# Patient Record
Sex: Male | Born: 1975 | Race: Black or African American | Hispanic: No | Marital: Married | Smoking: Never smoker
Health system: Southern US, Community
[De-identification: ages and names within clinical notes are randomized; demographics above are authoritative.]

## PROBLEM LIST (undated history)

## (undated) DIAGNOSIS — R51 Headache: Secondary | ICD-10-CM

## (undated) DIAGNOSIS — K5190 Ulcerative colitis, unspecified, without complications: Secondary | ICD-10-CM

## (undated) DIAGNOSIS — R519 Headache, unspecified: Secondary | ICD-10-CM

## (undated) DIAGNOSIS — S6990XA Unspecified injury of unspecified wrist, hand and finger(s), initial encounter: Secondary | ICD-10-CM

## (undated) DIAGNOSIS — I10 Essential (primary) hypertension: Secondary | ICD-10-CM

## (undated) HISTORY — DX: Headache, unspecified: R51.9

## (undated) HISTORY — DX: Headache: R51

## (undated) HISTORY — DX: Essential (primary) hypertension: I10

## (undated) HISTORY — PX: OTHER SURGICAL HISTORY: SHX169

## (undated) HISTORY — DX: Unspecified injury of unspecified wrist, hand and finger(s), initial encounter: S69.90XA

## (undated) HISTORY — PX: ROTATOR CUFF REPAIR: SHX139

---

## 2009-04-07 ENCOUNTER — Emergency Department (HOSPITAL_COMMUNITY): Admission: EM | Admit: 2009-04-07 | Discharge: 2009-04-08 | Payer: Self-pay | Admitting: Emergency Medicine

## 2009-04-07 ENCOUNTER — Emergency Department (HOSPITAL_COMMUNITY): Admission: EM | Admit: 2009-04-07 | Discharge: 2009-04-07 | Payer: Self-pay | Admitting: Family Medicine

## 2009-11-11 ENCOUNTER — Ambulatory Visit: Payer: Self-pay | Admitting: Internal Medicine

## 2009-11-11 DIAGNOSIS — J45909 Unspecified asthma, uncomplicated: Secondary | ICD-10-CM | POA: Insufficient documentation

## 2010-03-21 DIAGNOSIS — I10 Essential (primary) hypertension: Secondary | ICD-10-CM

## 2010-03-21 HISTORY — DX: Essential (primary) hypertension: I10
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
## 2010-05-07 ENCOUNTER — Ambulatory Visit (INDEPENDENT_AMBULATORY_CARE_PROVIDER_SITE_OTHER): Payer: BC Managed Care – PPO | Admitting: Internal Medicine

## 2010-05-07 ENCOUNTER — Encounter: Payer: Self-pay | Admitting: Internal Medicine

## 2010-05-07 ENCOUNTER — Other Ambulatory Visit: Payer: Self-pay | Admitting: Internal Medicine

## 2010-05-07 ENCOUNTER — Ambulatory Visit (HOSPITAL_BASED_OUTPATIENT_CLINIC_OR_DEPARTMENT_OTHER)
Admission: RE | Admit: 2010-05-07 | Discharge: 2010-05-07 | Disposition: A | Discharge status: Home / Self Care | Payer: BC Managed Care – PPO | Source: Ambulatory Visit | Attending: Internal Medicine | Admitting: Internal Medicine

## 2010-05-07 DIAGNOSIS — R51 Headache: Secondary | ICD-10-CM | POA: Insufficient documentation

## 2010-05-07 DIAGNOSIS — G43909 Migraine, unspecified, not intractable, without status migrainosus: Secondary | ICD-10-CM | POA: Insufficient documentation
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
## 2010-06-07 LAB — COMPREHENSIVE METABOLIC PANEL
ALT: 23 U/L (ref 0–53)
AST: 19 U/L (ref 0–37)
Albumin: 4 g/dL (ref 3.5–5.2)
Alkaline Phosphatase: 72 U/L (ref 39–117)
BUN: 8 mg/dL (ref 6–23)
CO2: 29 mEq/L (ref 19–32)
Calcium: 8.7 mg/dL (ref 8.4–10.5)
Chloride: 101 mEq/L (ref 96–112)
Creatinine, Ser: 1.08 mg/dL (ref 0.4–1.5)
GFR calc Af Amer: >60 mL/min (ref >60)
GFR calc non Af Amer: >60 mL/min (ref >60)
Glucose, Bld: 95 mg/dL (ref 70–99)
Potassium: 3.6 mEq/L (ref 3.5–5.1)
Sodium: 137 mEq/L (ref 135–145)
Total Bilirubin: 1.1 mg/dL (ref 0.3–1.2)
Total Protein: 7.6 g/dL (ref 6.0–8.3)

## 2010-06-07 LAB — BASIC METABOLIC PANEL
BUN: 9 mg/dL (ref 6–23)
CO2: 27 mEq/L (ref 19–32)
Calcium: 8.8 mg/dL (ref 8.4–10.5)
Chloride: 102 mEq/L (ref 96–112)
Creatinine, Ser: 1.07 mg/dL (ref 0.4–1.5)
GFR calc Af Amer: >60 mL/min (ref >60)
GFR calc non Af Amer: >60 mL/min (ref >60)
Glucose, Bld: 97 mg/dL (ref 70–99)
Potassium: 3.3 mEq/L — ABNORMAL LOW (ref 3.5–5.1)
Sodium: 140 mEq/L (ref 135–145)

## 2010-06-07 LAB — CBC
HCT: 44.5 % (ref 39.0–52.0)
HCT: 44.6 % (ref 39.0–52.0)
Hemoglobin: 14.9 g/dL (ref 13.0–17.0)
Hemoglobin: 14.9 g/dL (ref 13.0–17.0)
MCHC: 33.3 g/dL (ref 30.0–36.0)
MCHC: 33.5 g/dL (ref 30.0–36.0)
MCV: 95.2 fL (ref 78.0–100.0)
MCV: 96.5 fL (ref 78.0–100.0)
Platelets: 284 10*3/uL (ref 150–400)
Platelets: 293 10*3/uL (ref 150–400)
RBC: 4.62 MIL/uL (ref 4.22–5.81)
RBC: 4.67 MIL/uL (ref 4.22–5.81)
RDW: 13.8 % (ref 11.5–15.5)
RDW: 14 % (ref 11.5–15.5)
WBC: 13.9 10*3/uL — ABNORMAL HIGH (ref 4.0–10.5)
WBC: 14.4 10*3/uL — ABNORMAL HIGH (ref 4.0–10.5)

## 2010-06-07 LAB — HEMOCCULT GUIAC POC 1CARD (OFFICE): Fecal Occult Bld: NEGATIVE

## 2010-06-07 LAB — POCT URINALYSIS DIP (DEVICE)
Glucose, UA: NEGATIVE mg/dL
Ketones, ur: NEGATIVE mg/dL
Nitrite: NEGATIVE
Protein, ur: NEGATIVE mg/dL
Specific Gravity, Urine: 1.025 (ref 1.005–1.030)
Urobilinogen, UA: 1 mg/dL (ref 0.0–1.0)
pH: 5.5 (ref 5.0–8.0)

## 2010-06-07 LAB — DIFFERENTIAL
Basophils Absolute: 0.1 10*3/uL (ref 0.0–0.1)
Basophils Absolute: 0.1 10*3/uL (ref 0.0–0.1)
Basophils Relative: 0 % (ref 0–1)
Basophils Relative: 1 % (ref 0–1)
Eosinophils Absolute: 0.1 10*3/uL (ref 0.0–0.7)
Eosinophils Absolute: 0.1 10*3/uL (ref 0.0–0.7)
Eosinophils Relative: 1 % (ref 0–5)
Eosinophils Relative: 1 % (ref 0–5)
Lymphocytes Relative: 17 % (ref 12–46)
Lymphocytes Relative: 19 % (ref 12–46)
Lymphs Abs: 2.4 10*3/uL (ref 0.7–4.0)
Lymphs Abs: 2.7 10*3/uL (ref 0.7–4.0)
Monocytes Absolute: 1.1 10*3/uL — ABNORMAL HIGH (ref 0.1–1.0)
Monocytes Absolute: 1.5 10*3/uL — ABNORMAL HIGH (ref 0.1–1.0)
Monocytes Relative: 10 % (ref 3–12)
Monocytes Relative: 8 % (ref 3–12)
Neutro Abs: 10.1 10*3/uL — ABNORMAL HIGH (ref 1.7–7.7)
Neutro Abs: 10.2 10*3/uL — ABNORMAL HIGH (ref 1.7–7.7)
Neutrophils Relative %: 70 % (ref 43–77)
Neutrophils Relative %: 74 % (ref 43–77)

## 2010-06-07 LAB — LIPASE, BLOOD: Lipase: 18 U/L (ref 11–59)

## 2010-06-08 ENCOUNTER — Telehealth: Payer: Self-pay | Admitting: Family Medicine

## 2010-06-08 NOTE — Telephone Encounter (Signed)
Error

## 2010-06-10 ENCOUNTER — Encounter: Payer: Self-pay | Admitting: Internal Medicine

## 2010-06-11 ENCOUNTER — Ambulatory Visit (INDEPENDENT_AMBULATORY_CARE_PROVIDER_SITE_OTHER): Payer: BC Managed Care – PPO | Admitting: Internal Medicine

## 2010-06-11 VITALS — BP 144/92 | HR 82 | Wt 217.8 lb

## 2010-06-11 DIAGNOSIS — R51 Headache: Secondary | ICD-10-CM

## 2010-06-11 MED ORDER — HYDROCODONE-ACETAMINOPHEN 5-500 MG PO TABS: 1.0000 | ORAL_TABLET | ORAL | Status: DC | PRN

## 2010-06-11 MED ORDER — IBUPROFEN 600 MG PO TABS: 600.0000 mg | ORAL_TABLET | Freq: Three times a day (TID) | ORAL | Status: DC | PRN

## 2010-06-11 NOTE — Assessment & Plan Note (Addendum)
Several months  history of headache described as pain that originates from the right side of the neck and then goes to the  right side of the head. Good response to Motrin.  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 He continue with the headaches , they could be cervicogenic or related to the Chiari malformation. Plan:  discontinue Flexeril, continue with Motrin, add hydrocodone Refer to neuro

## 2010-06-11 NOTE — Patient Instructions (Signed)
We are referring you to a neurologist, if you don't hear from [place] in 1 week, please call the office  Came back at your convenience for a physical exam

## 2010-06-11 NOTE — Progress Notes (Signed)
  Subjective:    Patient ID: Corey Costa, male    DOB: 07/13/1975, 35 y.o.   MRN: [id number]  HPI  patient was seen several months ago with headaches, these prompted CT of the head that showed a Chiari malformation.  the patient was prescribed Flexeril and ibuprofen and asked to return to the office if the symptoms continue. He continue experiencing "headaches";  the pain starts in the right side of the neck  and then goes to the right side of the head. At some point he had some frontal sinus pain but that has  resolved. He states that ibuprofen helps a great deal, he self discontinued Flexeril because it didn't help.   Review of Systems  denies any upper extremity tingling.  Denies any GI side effects from Motrin.    Objective:   Physical Exam  Constitutional: He is oriented to person, place, and time. He appears well-developed and well-nourished.  Cardiovascular: Normal rate, regular rhythm and normal heart sounds.   Pulmonary/Chest: Effort normal and breath sounds normal.  Neurological: He is alert and oriented to person, place, and time. He has normal strength and normal reflexes. No cranial nerve deficit. Gait normal.  Psychiatric: He has a normal mood and affect. His behavior is normal. Thought content normal.          Assessment & Plan:

## 2010-06-21 ENCOUNTER — Telehealth: Payer: Self-pay | Admitting: Internal Medicine

## 2010-06-21 NOTE — Telephone Encounter (Signed)
Patient called to report that he was given an appointment to see a neurologist on 08/02/2010----he says that he cant wait that long----is there another neurologist that can take him sooner??

## 2010-06-22 NOTE — Telephone Encounter (Signed)
Left msg for return call

## 2010-06-23 MED ORDER — TRAMADOL HCL 50 MG PO TABS: 50.0000 mg | ORAL_TABLET | Freq: Four times a day (QID) | ORAL | Status: AC | PRN

## 2010-06-23 NOTE — Telephone Encounter (Signed)
Will switch to Ultram, let me know if that doesn't work [employer]

## 2010-06-23 NOTE — Telephone Encounter (Signed)
Spoke w/ pt informed rx sent to pharmacy.

## 2010-06-23 NOTE — Telephone Encounter (Signed)
Spoke w/ pt informed that appt for neuro is pretty good since it usually takes longer to get in.  Pt says that hydrocodone makes him sick to his stomach and would like to have something else to help w/ headaches.

## 2010-10-09 ENCOUNTER — Ambulatory Visit (INDEPENDENT_AMBULATORY_CARE_PROVIDER_SITE_OTHER): Payer: BC Managed Care – PPO | Admitting: Family Medicine

## 2010-10-09 ENCOUNTER — Encounter: Payer: Self-pay | Admitting: Family Medicine

## 2010-10-09 VITALS — BP 142/98 | Temp 98.5°F | Wt 217.0 lb

## 2010-10-09 DIAGNOSIS — R51 Headache: Secondary | ICD-10-CM

## 2010-10-09 DIAGNOSIS — IMO0001 Reserved for inherently not codable concepts without codable children: Secondary | ICD-10-CM

## 2010-10-09 DIAGNOSIS — R030 Elevated blood-pressure reading, without diagnosis of hypertension: Secondary | ICD-10-CM

## 2010-10-09 MED ORDER — TRAMADOL HCL 50 MG PO TABS: 50.0000 mg | ORAL_TABLET | Freq: Four times a day (QID) | ORAL | Status: AC | PRN

## 2010-10-09 NOTE — Patient Instructions (Signed)
Follow up with Dr Larose Kells regarding your chronic headaches and schedule follow up with him within 1 month to have blood pressure reassessed. May take up to 2 tramadol every 6 hours as needed for headache/neck pain.

## 2010-10-10 NOTE — Progress Notes (Signed)
  Subjective:    Patient ID: Corey Costa, male    DOB: 1976-02-14, 35 y.o.   MRN: [id number]  HPI Patient seen in [facility] with persistent headache. Going on for several months. Previously seen and had CT of head which showed question of Chiari malformation. He's tried ibuprofen and Flexeril without much relief. Saw neurologist and placed on Topamax and Imitrex without much. Has also tried hydrocodone but did not like the way he felt.  Reportedly had MRI scan performed and question of Chiari malformation not felt to be candidate for surgical intervention. Headache is sharp. No nausea or vomiting. No consistent photophobia. Occasionally worse with activity. No focal weakness or any cognitive changes. No right upper extremity pain.  Elevated blood pressure today but no confirmed history of hypertension. Still taking ibuprofen fairly regular. No history of neck injury   Review of Systems  Constitutional: Negative for fever, activity change, appetite change and unexpected weight change.  HENT: Negative for hearing loss and neck stiffness.   Eyes: Negative for photophobia and visual disturbance.  Respiratory: Negative for shortness of breath.   Cardiovascular: Negative for chest pain.  Neurological: Positive for headaches. Negative for dizziness, tremors, seizures, syncope and weakness.  Psychiatric/Behavioral: Negative for confusion.       Objective:   Physical Exam  Constitutional: He is oriented to person, place, and time. He appears well-developed and well-nourished. No distress.  HENT:  Head: Normocephalic and atraumatic.  Mouth/Throat: Oropharynx is clear and moist.  Eyes: Pupils are equal, round, and reactive to light.  Neck: Normal range of motion. Neck supple.  Neurological: He is alert and oriented to person, place, and time. He has normal strength and normal reflexes. He displays no tremor. No cranial nerve deficit. Coordination and gait normal.       Cerebellar-finger to  nose normal.          Assessment & Plan:  #1  Occipital headache.  ?occipital neuralgia.  Pt encouraged to follow up with primary to discuss.  ?[facility].  Pt given rx for tramadol 50 mg 1-2 po q 6hours prn. #2  Elevated blood pressure.  Sodium reduction and follow up with primary to discuss.

## 2010-10-13 ENCOUNTER — Telehealth: Payer: Self-pay | Admitting: Internal Medicine

## 2010-10-13 NOTE — Telephone Encounter (Signed)
Went to [facility] w/ persistent HA: call pt, needs to contact neuro and be rechecked since HA is not improving

## 2010-10-14 NOTE — Telephone Encounter (Signed)
Message left for patient to return my call.  

## 2010-10-15 NOTE — Telephone Encounter (Signed)
Pt is aware.  

## 2010-10-15 NOTE — Telephone Encounter (Signed)
Message left for patient to return my call.  

## 2011-01-13 ENCOUNTER — Encounter (HOSPITAL_BASED_OUTPATIENT_CLINIC_OR_DEPARTMENT_OTHER): Payer: Self-pay | Admitting: *Deleted

## 2011-01-13 ENCOUNTER — Emergency Department (HOSPITAL_BASED_OUTPATIENT_CLINIC_OR_DEPARTMENT_OTHER)
Admission: EM | Admit: 2011-01-13 | Discharge: 2011-01-13 | Disposition: A | Discharge status: Home / Self Care | Payer: BC Managed Care – PPO | Attending: Emergency Medicine | Admitting: Emergency Medicine

## 2011-01-13 ENCOUNTER — Telehealth: Payer: Self-pay

## 2011-01-13 DIAGNOSIS — K625 Hemorrhage of anus and rectum: Secondary | ICD-10-CM | POA: Insufficient documentation

## 2011-01-13 DIAGNOSIS — R109 Unspecified abdominal pain: Secondary | ICD-10-CM | POA: Insufficient documentation

## 2011-01-13 LAB — BASIC METABOLIC PANEL
BUN: 13 mg/dL (ref 6–23)
CO2: 28 mEq/L (ref 19–32)
Calcium: 8.8 mg/dL (ref 8.4–10.5)
Chloride: 106 mEq/L (ref 96–112)
Creatinine, Ser: 1 mg/dL (ref 0.50–1.35)
GFR calc Af Amer: >90 mL/min (ref >90)
GFR calc non Af Amer: >90 mL/min (ref >90)
Glucose, Bld: 104 mg/dL — ABNORMAL HIGH (ref 70–99)
Potassium: 4 mEq/L (ref 3.5–5.1)
Sodium: 141 mEq/L (ref 135–145)

## 2011-01-13 LAB — CBC
HCT: 41.6 % (ref 39.0–52.0)
Hemoglobin: 14.1 g/dL (ref 13.0–17.0)
MCH: 30.1 pg (ref 26.0–34.0)
MCHC: 33.9 g/dL (ref 30.0–36.0)
MCV: 88.9 fL (ref 78.0–100.0)
Platelets: 314 10*3/uL (ref 150–400)
RBC: 4.68 MIL/uL (ref 4.22–5.81)
RDW: 13.4 % (ref 11.5–15.5)
WBC: 11 10*3/uL — ABNORMAL HIGH (ref 4.0–10.5)

## 2011-01-13 LAB — URINALYSIS, ROUTINE W REFLEX MICROSCOPIC
Bilirubin Urine: NEGATIVE
Glucose, UA: NEGATIVE mg/dL
Hgb urine dipstick: NEGATIVE
Ketones, ur: NEGATIVE mg/dL
Nitrite: NEGATIVE
Protein, ur: NEGATIVE mg/dL
Specific Gravity, Urine: 1.027 (ref 1.005–1.030)
Urobilinogen, UA: 0.2 mg/dL (ref 0.0–1.0)
pH: 6.5 (ref 5.0–8.0)

## 2011-01-13 LAB — URINE MICROSCOPIC-ADD ON

## 2011-01-13 LAB — OCCULT BLOOD X 1 CARD TO LAB, STOOL: Fecal Occult Bld: NEGATIVE

## 2011-01-13 NOTE — ED Notes (Signed)
Pt denies any nausea or vomiting denies fever or chills
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
## 2011-01-13 NOTE — ED Notes (Signed)
Lower abdominal pain onset 2 days ago states saw blood in stool states he saw a "clot" then red blood in toilet

## 2011-01-13 NOTE — Telephone Encounter (Signed)
Msg from wife and patent BP is elevated at 170/102 and he has been seen at multiple Urgent Care centers and they are concerned that he may need to be on BP meds. Patient as had some rectal bleeding.     I have tried to call patient and got the VM so I left a msg to call the office.    KP

## 2011-01-14 ENCOUNTER — Ambulatory Visit: Payer: BC Managed Care – PPO | Admitting: Family Medicine

## 2011-01-14 LAB — URINE CULTURE
Colony Count: NO GROWTH
Culture  Setup Time: 201210252018
Culture: NO GROWTH

## 2011-01-14 LAB — GC/CHLAMYDIA PROBE AMP, URINE
Chlamydia, Swab/Urine, PCR: NEGATIVE
GC Probe Amp, Urine: NEGATIVE

## 2011-01-14 NOTE — Telephone Encounter (Signed)
Discussed with wife patient BP have been elevated and he is passing blood clots in his stool. He was seen in the ED and wants to been seen asap. Wife asked if Dr. Etter Sjogren would see patient.  Apt scheduled 01/13/11 at 2:45 and Dr.Lowne agreed    KP

## 2011-03-11 ENCOUNTER — Encounter: Payer: Self-pay | Admitting: Internal Medicine

## 2011-03-11 ENCOUNTER — Ambulatory Visit (INDEPENDENT_AMBULATORY_CARE_PROVIDER_SITE_OTHER): Payer: BC Managed Care – PPO | Admitting: Internal Medicine

## 2011-03-11 VITALS — BP 160/110 | HR 97 | Temp 98.1°F | Ht 66.75 in | Wt 208.0 lb

## 2011-03-11 DIAGNOSIS — I10 Essential (primary) hypertension: Secondary | ICD-10-CM

## 2011-03-11 DIAGNOSIS — R51 Headache: Secondary | ICD-10-CM

## 2011-03-11 MED ORDER — METOPROLOL TARTRATE 50 MG PO TABS: 50.0000 mg | ORAL_TABLET | Freq: Two times a day (BID) | ORAL | Status: DC

## 2011-03-11 MED ORDER — LOSARTAN POTASSIUM 50 MG PO TABS: 50.0000 mg | ORAL_TABLET | Freq: Every day | ORAL | Status: DC

## 2011-03-11 NOTE — Assessment & Plan Note (Addendum)
Persistent headaches for months, has seen neurology, they order a MRI MRA and the patient reports they were normal. He also prescribed Topamax and sumatriptan but the medicines didn't help.  He presents today for evaluation of hypertension,fundoscopy is negative, I doubt the headache is related to hypertension, on the other hand I also like to see  how he does once his BP is better and he takes BB

## 2011-03-11 NOTE — Assessment & Plan Note (Signed)
BP has been moderately elevated for months, recently has been as high as 177/112. Recent BMP normal. Will start beta blockers (hoping to help also his headaches) and losartan. Patient aware that his BP may go too low, he knows to call us and cut his medications a half. see instructions
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
## 2011-03-11 NOTE — Patient Instructions (Signed)
Start meds as prescribed Low salt diet Check the  blood pressure daily, be sure it is between 110/60 and 140/85. If is too low you may feel dizzy, weak: cut losartan in 1/2 and let us know Nurse visit in 10 days for a BP check See me in 1 month

## 2011-03-16 ENCOUNTER — Telehealth: Payer: Self-pay | Admitting: *Deleted

## 2011-03-16 NOTE — Telephone Encounter (Signed)
Left message to call office

## 2011-03-16 NOTE — Telephone Encounter (Signed)
Message copied by Marylen Ponto on Wed Mar 16, 2011 10:58 AM ------      Message from: [name] E      Created: Fri Mar 11, 2011  6:32 PM       Please check on him, BP better, HA better?

## 2011-03-17 NOTE — Telephone Encounter (Signed)
Left message to call office

## 2011-03-21 NOTE — Telephone Encounter (Signed)
Discussed with patient and he stated BP is fine and he has not had any headaches and he stated he felt a lot better.    KP

## 2011-03-21 NOTE — Telephone Encounter (Signed)
Thank you :)

## 2011-05-31 ENCOUNTER — Other Ambulatory Visit: Payer: Self-pay | Admitting: Internal Medicine

## 2011-06-01 ENCOUNTER — Other Ambulatory Visit: Payer: Self-pay | Admitting: Internal Medicine

## 2011-06-01 NOTE — Telephone Encounter (Signed)
Refill done.  

## 2011-11-23 ENCOUNTER — Other Ambulatory Visit: Payer: Self-pay | Admitting: Internal Medicine

## 2011-11-23 NOTE — Telephone Encounter (Signed)
Refill done.  

## 2012-04-19 ENCOUNTER — Other Ambulatory Visit: Payer: Self-pay | Admitting: Internal Medicine

## 2012-04-19 NOTE — Telephone Encounter (Signed)
Pt has not been seen within a year. OK to refill?

## 2012-04-20 NOTE — Telephone Encounter (Signed)
Refill done for 2 week supply. Pt made aware.

## 2012-04-20 NOTE — Telephone Encounter (Signed)
Schedule an appointment within 2 weeks, call a two-week supply

## 2012-05-18 ENCOUNTER — Ambulatory Visit (INDEPENDENT_AMBULATORY_CARE_PROVIDER_SITE_OTHER): Payer: BC Managed Care – PPO | Admitting: Internal Medicine

## 2012-05-18 ENCOUNTER — Encounter: Payer: Self-pay | Admitting: Internal Medicine

## 2012-05-18 VITALS — BP 134/86 | HR 67 | Temp 98.4°F | Wt 219.0 lb

## 2012-05-18 DIAGNOSIS — I10 Essential (primary) hypertension: Secondary | ICD-10-CM

## 2012-05-18 DIAGNOSIS — D72829 Elevated white blood cell count, unspecified: Secondary | ICD-10-CM

## 2012-05-18 LAB — CBC WITH DIFFERENTIAL/PLATELET
Basophils Absolute: 0 10*3/uL (ref 0.0–0.1)
Basophils Relative: 0 % (ref 0–1)
Eosinophils Absolute: 0.2 10*3/uL (ref 0.0–0.7)
Eosinophils Relative: 1 % (ref 0–5)
HCT: 42.5 % (ref 39.0–52.0)
Hemoglobin: 14.6 g/dL (ref 13.0–17.0)
Lymphocytes Relative: 21 % (ref 12–46)
Lymphs Abs: 2.6 10*3/uL (ref 0.7–4.0)
MCH: 30.8 pg (ref 26.0–34.0)
MCHC: 34.4 g/dL (ref 30.0–36.0)
MCV: 89.7 fL (ref 78.0–100.0)
Monocytes Absolute: 1.1 10*3/uL — ABNORMAL HIGH (ref 0.1–1.0)
Monocytes Relative: 9 % (ref 3–12)
Neutro Abs: 8.2 10*3/uL — ABNORMAL HIGH (ref 1.7–7.7)
Neutrophils Relative %: 69 % (ref 43–77)
Platelets: 367 10*3/uL (ref 150–400)
RBC: 4.74 MIL/uL (ref 4.22–5.81)
RDW: 14.2 % (ref 11.5–15.5)
WBC: 12.1 10*3/uL — ABNORMAL HIGH (ref 4.0–10.5)

## 2012-05-18 LAB — BASIC METABOLIC PANEL
BUN: 14 mg/dL (ref 6–23)
CO2: 26 mEq/L (ref 19–32)
Calcium: 9.5 mg/dL (ref 8.4–10.5)
Chloride: 105 mEq/L (ref 96–112)
Creat: 1.23 mg/dL (ref 0.50–1.35)
Glucose, Bld: 81 mg/dL (ref 70–99)
Potassium: 4.3 mEq/L (ref 3.5–5.3)
Sodium: 142 mEq/L (ref 135–145)

## 2012-05-18 LAB — TSH: TSH: 1.165 u[IU]/mL (ref 0.350–4.500)

## 2012-05-18 MED ORDER — METOPROLOL TARTRATE 50 MG PO TABS: 50.0000 mg | ORAL_TABLET | Freq: Two times a day (BID) | ORAL | Status: DC

## 2012-05-18 MED ORDER — LOSARTAN POTASSIUM 50 MG PO TABS: 50.0000 mg | ORAL_TABLET | Freq: Every day | ORAL | Status: DC

## 2012-05-18 NOTE — Assessment & Plan Note (Signed)
Good med compliance , no recent ambulatory BPs. He is concerned about his weight, we had a long discussion about diet and exercise. Plan: Refill medications, labs. Return to the office in 6 months for a complete physical.

## 2012-05-18 NOTE — Patient Instructions (Addendum)
Check the  blood pressure 2 or 3 times a week, be sure it is between 110/60 and 140/85. If it is consistently higher or lower, let me know
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
## 2012-05-19 ENCOUNTER — Encounter: Payer: Self-pay | Admitting: Internal Medicine

## 2012-05-22 NOTE — Addendum Note (Signed)
Addended by: Douglass Rivers T on: 05/22/2012 05:26 PM   Modules accepted: Orders

## 2012-05-23 ENCOUNTER — Telehealth: Payer: Self-pay | Admitting: Internal Medicine

## 2012-05-23 NOTE — Telephone Encounter (Signed)
Discussed with pt

## 2012-05-23 NOTE — Telephone Encounter (Signed)
Patient calling this morning, is aware he is being referred to see Hematology, but is asking for his results to be reviewed with him again please.  Would like a call back.

## 2012-11-16 ENCOUNTER — Ambulatory Visit: Payer: BC Managed Care – PPO | Admitting: Internal Medicine

## 2013-05-10 ENCOUNTER — Ambulatory Visit: Payer: Self-pay | Admitting: Internal Medicine

## 2013-06-17 ENCOUNTER — Telehealth: Payer: Self-pay | Admitting: Internal Medicine

## 2013-06-17 NOTE — Telephone Encounter (Signed)
Patient called and requested a refill for losartan (COZAAR) 50 MG tablet and metoprolol (LOPRESSOR) 50 MG tablet. Also patient wanted to see if he could get it refilled today because he has been having headaches. Please advise   Pharmacy  [facility]

## 2013-06-18 MED ORDER — LOSARTAN POTASSIUM 50 MG PO TABS: 50.0000 mg | ORAL_TABLET | Freq: Every day | ORAL | Status: DC

## 2013-06-18 MED ORDER — METOPROLOL TARTRATE 50 MG PO TABS: 50.0000 mg | ORAL_TABLET | Freq: Two times a day (BID) | ORAL | Status: DC

## 2013-06-18 NOTE — Telephone Encounter (Signed)
Done . rx sent

## 2013-06-19 ENCOUNTER — Encounter: Payer: Self-pay | Admitting: Internal Medicine

## 2013-06-19 ENCOUNTER — Telehealth: Payer: Self-pay | Admitting: Internal Medicine

## 2013-06-19 ENCOUNTER — Ambulatory Visit (HOSPITAL_BASED_OUTPATIENT_CLINIC_OR_DEPARTMENT_OTHER)
Admission: RE | Admit: 2013-06-19 | Discharge: 2013-06-19 | Disposition: A | Discharge status: Home / Self Care | Payer: BC Managed Care – PPO | Source: Ambulatory Visit | Attending: Internal Medicine | Admitting: Internal Medicine

## 2013-06-19 ENCOUNTER — Ambulatory Visit (INDEPENDENT_AMBULATORY_CARE_PROVIDER_SITE_OTHER): Payer: BC Managed Care – PPO | Admitting: Internal Medicine

## 2013-06-19 VITALS — BP 144/82 | HR 79 | Temp 98.3°F | Wt 225.0 lb

## 2013-06-19 DIAGNOSIS — R51 Headache: Secondary | ICD-10-CM

## 2013-06-19 DIAGNOSIS — I10 Essential (primary) hypertension: Secondary | ICD-10-CM

## 2013-06-19 DIAGNOSIS — D72829 Elevated white blood cell count, unspecified: Secondary | ICD-10-CM | POA: Insufficient documentation

## 2013-06-19 MED ORDER — SUMATRIPTAN SUCCINATE 50 MG PO TABS: ORAL_TABLET | ORAL | Status: DC

## 2013-06-19 MED ORDER — HYDROCODONE-ACETAMINOPHEN 5-325 MG PO TABS: 1.0000 | ORAL_TABLET | Freq: Three times a day (TID) | ORAL | Status: DC | PRN

## 2013-06-19 MED ORDER — TOPIRAMATE 25 MG PO TABS: ORAL_TABLET | ORAL | Status: DC

## 2013-06-19 NOTE — Assessment & Plan Note (Addendum)
Off  medications for a while due to insurance issue, restarted medication a few days ago. BP was elevated before  but today is ok. Plan: Continue medications, labs when he comes back in 3 weeks

## 2013-06-19 NOTE — Assessment & Plan Note (Signed)
Chronic leukocytosis, was refer to hematology but apparently did not go. Reassess on return to the office

## 2013-06-19 NOTE — Assessment & Plan Note (Addendum)
(  see previous entry) 3 months history of headache, on and off.  Although the headache is somehow different to the headache before, description today fits  migraines better than in the past. Plan: CT head, has occasional neck stiffness Retrial of Topamax, Imitrex; also Hydrocodone Come back in 2 weeks, call anytime is symptoms severe

## 2013-06-19 NOTE — Telephone Encounter (Signed)
Relevant patient education mailed to patient.  

## 2013-06-19 NOTE — Progress Notes (Signed)
Pre visit review using our clinic review tool, if applicable. No additional management support is needed unless otherwise documented below in the visit note. 
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
## 2013-06-19 NOTE — Patient Instructions (Signed)
Talk to [facility]  about your CT of the head  Start taking Topamax, follow the instructions in the box  If you have a headache: Take Imitrex as prescribed Rest, fluids, you can also take Motrin. If headache  continue, take Vicodin, will make you drowsy.  Followup here in 2-3 weeks Call anytime if the headache is severe or worse.

## 2013-06-20 ENCOUNTER — Telehealth: Payer: Self-pay

## 2013-06-20 DIAGNOSIS — IMO0002 Reserved for concepts with insufficient information to code with codable children: Secondary | ICD-10-CM

## 2013-06-20 NOTE — Telephone Encounter (Signed)
LM on voice mail for CB.

## 2013-06-20 NOTE — Telephone Encounter (Signed)
Message copied by Reino Bellis on Thu Jun 20, 2013  3:09 PM ------      Message from: [name] E      Created: Thu Jun 20, 2013  1:19 PM       Advise patient, CT normal but there is a question of a Chiari malformation. Nothing to be alarmed about but needs to see neurology. Please arrange a referral ------

## 2013-06-24 NOTE — Telephone Encounter (Signed)
Advised patient per results. Referral entered.

## 2013-07-05 ENCOUNTER — Ambulatory Visit: Payer: BC Managed Care – PPO | Admitting: Internal Medicine

## 2013-07-11 ENCOUNTER — Encounter: Payer: Self-pay | Admitting: Internal Medicine

## 2013-08-05 ENCOUNTER — Encounter: Payer: Self-pay | Admitting: Internal Medicine

## 2013-08-05 ENCOUNTER — Ambulatory Visit (INDEPENDENT_AMBULATORY_CARE_PROVIDER_SITE_OTHER): Payer: BC Managed Care – PPO | Admitting: Internal Medicine

## 2013-08-05 VITALS — BP 143/88 | HR 78 | Temp 98.2°F | Wt 225.8 lb

## 2013-08-05 DIAGNOSIS — E663 Overweight: Secondary | ICD-10-CM

## 2013-08-05 DIAGNOSIS — R51 Headache: Secondary | ICD-10-CM

## 2013-08-05 DIAGNOSIS — D72829 Elevated white blood cell count, unspecified: Secondary | ICD-10-CM

## 2013-08-05 DIAGNOSIS — Z6835 Body mass index (BMI) 35.0-35.9, adult: Secondary | ICD-10-CM | POA: Insufficient documentation

## 2013-08-05 DIAGNOSIS — I10 Essential (primary) hypertension: Secondary | ICD-10-CM

## 2013-08-05 LAB — CBC WITH DIFFERENTIAL/PLATELET
Basophils Absolute: 0 10*3/uL (ref 0.0–0.1)
Basophils Relative: 0.2 % (ref 0.0–3.0)
Eosinophils Absolute: 0.3 10*3/uL (ref 0.0–0.7)
Eosinophils Relative: 2.3 % (ref 0.0–5.0)
HCT: 45.9 % (ref 39.0–52.0)
Hemoglobin: 14.9 g/dL (ref 13.0–17.0)
Lymphocytes Relative: 17.6 % (ref 12.0–46.0)
Lymphs Abs: 1.9 10*3/uL (ref 0.7–4.0)
MCHC: 32.5 g/dL (ref 30.0–36.0)
MCV: 92.3 fl (ref 78.0–100.0)
Monocytes Absolute: 1 10*3/uL (ref 0.1–1.0)
Monocytes Relative: 8.9 % (ref 3.0–12.0)
Neutro Abs: 7.8 10*3/uL — ABNORMAL HIGH (ref 1.4–7.7)
Neutrophils Relative %: 71 % (ref 43.0–77.0)
Platelets: 327 10*3/uL (ref 150.0–400.0)
RBC: 4.97 Mil/uL (ref 4.22–5.81)
RDW: 15 % (ref 11.5–15.5)
WBC: 11 10*3/uL — ABNORMAL HIGH (ref 4.0–10.5)

## 2013-08-05 LAB — BASIC METABOLIC PANEL
BUN: 10 mg/dL (ref 6–23)
CO2: 30 mEq/L (ref 19–32)
Calcium: 9.1 mg/dL (ref 8.4–10.5)
Chloride: 105 mEq/L (ref 96–112)
Creatinine, Ser: 1.3 mg/dL (ref 0.4–1.5)
GFR: 79.27 mL/min (ref >60.00)
Glucose, Bld: 70 mg/dL (ref 70–99)
Potassium: 3.8 mEq/L (ref 3.5–5.1)
Sodium: 141 mEq/L (ref 135–145)

## 2013-08-05 MED ORDER — TOPIRAMATE 25 MG PO TABS: ORAL_TABLET | ORAL | Status: DC

## 2013-08-05 MED ORDER — METOPROLOL TARTRATE 50 MG PO TABS: 75.0000 mg | ORAL_TABLET | Freq: Two times a day (BID) | ORAL | Status: DC

## 2013-08-05 NOTE — Assessment & Plan Note (Signed)
BP in the 140s over 80s, would like to see slightly better. Increase metoprolol from 50 mg twice a day to 75 mg twice a day, labs

## 2013-08-05 NOTE — Assessment & Plan Note (Addendum)
Started Topamax, headache frequency decreased from 3-4 week to one or 2 episodes a week. 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 Does not like Imitrex, feels 'weird". Plan: Increase Topamax from 50 mg bid to 75 mg twice a day Keep appointment with neurology For abortive therapy, use Motrin instead of Imitrex

## 2013-08-05 NOTE — Assessment & Plan Note (Signed)
Extensive discussion about ways to lose weight including calorie counting and continue with exercise.

## 2013-08-05 NOTE — Assessment & Plan Note (Signed)
Recheck a CBC today
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
## 2013-08-05 NOTE — Patient Instructions (Addendum)
Get your blood work before you leave   Increase metoprolol 50 mg to ----> 1.5 tablet twice a day as Increase Topamax 50 mg to ---->  1.5 tablet twice a day  Ask them to make you a copy of the CT of the head and take it to the  neurologist appointment.   Next visit is for a physical exam in 3 months , fasting Please make an appointment

## 2013-08-07 ENCOUNTER — Telehealth: Payer: Self-pay | Admitting: *Deleted

## 2013-08-07 DIAGNOSIS — D72829 Elevated white blood cell count, unspecified: Secondary | ICD-10-CM

## 2013-08-07 NOTE — Telephone Encounter (Addendum)
Spoke with patient and made aware of elevated WBC and that a referral to heme would be made. Pt verbalized understanding. Referral to heme placed

## 2013-08-07 NOTE — Telephone Encounter (Signed)
Message copied by Chilton Greathouse on Wed Aug 07, 2013 10:13 AM ------      Message from: [name] E      Created: Tue Aug 06, 2013  9:14 PM       Advise patient,  WBCs continue to be slightly elevated, arrange a non urgent  hematology referral ---dx leukocytosis ------

## 2013-08-15 ENCOUNTER — Encounter: Payer: Self-pay | Admitting: *Deleted

## 2013-08-15 ENCOUNTER — Telehealth: Payer: Self-pay | Admitting: Internal Medicine

## 2013-08-15 NOTE — Telephone Encounter (Signed)
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

## 2013-08-15 NOTE — Telephone Encounter (Signed)
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

## 2013-09-30 ENCOUNTER — Telehealth: Payer: Self-pay | Admitting: Internal Medicine

## 2013-09-30 MED ORDER — HYDROCODONE-ACETAMINOPHEN 5-325 MG PO TABS: 1.0000 | ORAL_TABLET | Freq: Three times a day (TID) | ORAL | Status: DC | PRN

## 2013-09-30 NOTE — Telephone Encounter (Signed)
Last OV 08/05/2013 Last Refill 06/19/2013 #30 no refills PRN for headaches No UDS or contract

## 2013-09-30 NOTE — Telephone Encounter (Signed)
Done. Pt notified.

## 2013-09-30 NOTE — Telephone Encounter (Signed)
Advise patient, will refill few tablets today however that is not good for long-term management of headaches.

## 2013-09-30 NOTE — Telephone Encounter (Signed)
Caller name: Ginny Forth Relation to pt: self Call back number:[phone]   Reason for call:  Pt is requesting a refill on RX HYDROcodone-acetaminophen (NORCO/VICODIN) 5-325 MG

## 2013-10-10 ENCOUNTER — Telehealth: Payer: Self-pay | Admitting: *Deleted

## 2013-10-10 NOTE — Telephone Encounter (Signed)
Pt brought in FMLA form.  Billing sheet attached and placed in folder for Dr. Birdie Riddle to complete for Dr. Rolla Flatten

## 2013-10-11 NOTE — Telephone Encounter (Signed)
Advised patient that PCP out of office until 10/21/2013. Paperwork will not be completed prior to his return. Patient states that he will reach out to HR and let them know his provider is unavailable.

## 2013-10-15 NOTE — Telephone Encounter (Signed)
LMOM @ (9:16am) informing the pt that Dr. Larose Kells is out of the office and he will not return until next week, so we will not be able to complete his FMLA form until he returns.   Informed the pt if he has any question to please give me a call back.//AB/CMA

## 2013-10-21 DIAGNOSIS — Z0279 Encounter for issue of other medical certificate: Secondary | ICD-10-CM

## 2013-10-21 NOTE — Telephone Encounter (Signed)
Received completed and signed FMLA(Intermittent) form from Dr. Larose Kells.  All forms faxed to Centralized Leave Management Team/HR at [phone]).  Confirmation received.  Pt aware.//AB/CMA

## 2013-11-08 ENCOUNTER — Encounter: Payer: Self-pay | Admitting: Internal Medicine

## 2013-11-08 ENCOUNTER — Ambulatory Visit (INDEPENDENT_AMBULATORY_CARE_PROVIDER_SITE_OTHER): Payer: BC Managed Care – PPO | Admitting: Internal Medicine

## 2013-11-08 VITALS — BP 142/68 | HR 72 | Temp 98.5°F | Ht 66.0 in | Wt 217.0 lb

## 2013-11-08 DIAGNOSIS — R51 Headache: Secondary | ICD-10-CM

## 2013-11-08 DIAGNOSIS — I10 Essential (primary) hypertension: Secondary | ICD-10-CM

## 2013-11-08 DIAGNOSIS — Z0000 Encounter for general adult medical examination without abnormal findings: Secondary | ICD-10-CM

## 2013-11-08 DIAGNOSIS — D72829 Elevated white blood cell count, unspecified: Secondary | ICD-10-CM

## 2013-11-08 NOTE — Assessment & Plan Note (Signed)
Td 2010 Never had a scope Labs Doing better w/ diet-exercise , has lost several pounds, praised!
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
## 2013-11-08 NOTE — Patient Instructions (Signed)
Please come back fasting within few days  for labs only:  FLP-- dx V70   Next visit is for routine check up regards your headaches , blood pressure in 6 months

## 2013-11-08 NOTE — Progress Notes (Signed)
Pre-visit discussion using our clinic review tool. No additional management support is needed unless otherwise documented below in the visit note.  

## 2013-11-09 NOTE — Assessment & Plan Note (Signed)
Headaches, currently on Topamax one tablet twice a day, headaches have decreased, usually one episode a month, declined to increase Topamax. He has been using Imitrex sometimes only for severe headaches

## 2013-11-09 NOTE — Assessment & Plan Note (Signed)
Hypertension, unable to cut metoprolol in half, has been taking metoprolol 50 mg one tablet twice a day. BPs in the 130s, 140s. Plan: Increase metoprolol to 2 tablets in the morning and one in the afternoon.

## 2013-11-09 NOTE — Assessment & Plan Note (Signed)
hematology consult pending

## 2013-11-14 ENCOUNTER — Other Ambulatory Visit (INDEPENDENT_AMBULATORY_CARE_PROVIDER_SITE_OTHER): Payer: BC Managed Care – PPO

## 2013-11-14 DIAGNOSIS — Z0000 Encounter for general adult medical examination without abnormal findings: Secondary | ICD-10-CM

## 2013-11-14 LAB — LIPID PANEL
Cholesterol: 156 mg/dL (ref 0–200)
HDL: 36.7 mg/dL — ABNORMAL LOW (ref >39.00)
LDL Cholesterol: 103 mg/dL — ABNORMAL HIGH (ref 0–99)
NonHDL: 119.3
Total CHOL/HDL Ratio: 4
Triglycerides: 84 mg/dL (ref 0.0–149.0)
VLDL: 16.8 mg/dL (ref 0.0–40.0)

## 2013-12-26 ENCOUNTER — Telehealth: Payer: Self-pay | Admitting: Internal Medicine

## 2013-12-26 MED ORDER — LOSARTAN POTASSIUM 50 MG PO TABS: 50.0000 mg | ORAL_TABLET | Freq: Every day | ORAL | Status: DC

## 2013-12-26 NOTE — Telephone Encounter (Signed)
Medication sent to [facility] on [address] in [place].

## 2013-12-26 NOTE — Telephone Encounter (Signed)
Caller name: Ginny Forth  Call back [phone] Pharmacy: [facility] [address]  Reason for call:  Pt wants a refill on Rx losartan (COZAAR) 50 MG tablet

## 2014-01-08 ENCOUNTER — Telehealth: Payer: Self-pay | Admitting: Internal Medicine

## 2014-01-08 NOTE — Telephone Encounter (Signed)
Please advise 

## 2014-01-08 NOTE — Telephone Encounter (Signed)
Caller name: Ginny Forth Relation to pt: self  Call back number: [phone] Pharmacy:  Reason for call:    Needs fmla note to say "as needed" instead of two-three times a month. Patient states that he left work early on Monday and was out yesterday. Went to work today and left early. Patient states that this will protect him for future time out

## 2014-01-08 NOTE — Telephone Encounter (Signed)
No problem, bring the form  and I will correct it

## 2014-01-09 ENCOUNTER — Telehealth: Payer: Self-pay | Admitting: Internal Medicine

## 2014-01-09 NOTE — Telephone Encounter (Signed)
LMOM for Pt to bring FMLA form into office and Dr. Larose Kells would revise.

## 2014-01-09 NOTE — Telephone Encounter (Signed)
Caller name: Ginny Forth    Reason for call: Pt returned called. Pt was informed of the FMLA form and states will bring in the new form next week. Thank you

## 2014-01-09 NOTE — Telephone Encounter (Signed)
Noted  

## 2014-01-21 ENCOUNTER — Telehealth: Payer: Self-pay | Admitting: *Deleted

## 2014-01-21 NOTE — Telephone Encounter (Signed)
error 

## 2014-01-21 NOTE — Telephone Encounter (Signed)
Patient dropped off new paperwork to be filled out to request change below. Forms filled out and forwarded to Dr. Larose Kells. JG//CMA

## 2014-01-22 DIAGNOSIS — Z7689 Persons encountering health services in other specified circumstances: Secondary | ICD-10-CM

## 2014-01-22 NOTE — Telephone Encounter (Signed)
Completed forms faxed to Centralized Leave management Team/HR at [phone]. JG//CMA

## 2014-01-22 NOTE — Telephone Encounter (Signed)
Called and left detailed message informing patient that forms were faxed. JG//CMA

## 2014-08-19 ENCOUNTER — Encounter: Payer: Self-pay | Admitting: Internal Medicine

## 2014-08-19 ENCOUNTER — Ambulatory Visit (INDEPENDENT_AMBULATORY_CARE_PROVIDER_SITE_OTHER): Payer: 59 | Admitting: Internal Medicine

## 2014-08-19 VITALS — BP 148/92 | HR 90 | Temp 98.2°F | Ht 66.0 in | Wt 225.4 lb

## 2014-08-19 DIAGNOSIS — G43009 Migraine without aura, not intractable, without status migrainosus: Secondary | ICD-10-CM

## 2014-08-19 DIAGNOSIS — I10 Essential (primary) hypertension: Secondary | ICD-10-CM | POA: Diagnosis not present

## 2014-08-19 MED ORDER — LOSARTAN POTASSIUM 50 MG PO TABS: 50.0000 mg | ORAL_TABLET | Freq: Every day | ORAL | Status: DC

## 2014-08-19 MED ORDER — METOPROLOL TARTRATE 50 MG PO TABS: ORAL_TABLET | ORAL | Status: DC

## 2014-08-19 NOTE — Progress Notes (Signed)
Pre visit review using our clinic review tool, if applicable. No additional management support is needed unless otherwise documented below in the visit note. 
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
## 2014-08-19 NOTE — Assessment & Plan Note (Signed)
Well-controlled, no change

## 2014-08-19 NOTE — Patient Instructions (Signed)
Call if you have any unusual headache

## 2014-09-19 ENCOUNTER — Ambulatory Visit (INDEPENDENT_AMBULATORY_CARE_PROVIDER_SITE_OTHER): Payer: 59 | Admitting: Internal Medicine

## 2014-09-19 ENCOUNTER — Encounter: Payer: Self-pay | Admitting: Internal Medicine

## 2014-09-19 VITALS — BP 142/90 | HR 67 | Temp 98.2°F | Ht 66.0 in | Wt 228.5 lb

## 2014-09-19 DIAGNOSIS — I10 Essential (primary) hypertension: Secondary | ICD-10-CM

## 2014-09-19 DIAGNOSIS — G43009 Migraine without aura, not intractable, without status migrainosus: Secondary | ICD-10-CM

## 2014-09-19 MED ORDER — SUMATRIPTAN SUCCINATE 50 MG PO TABS
ORAL_TABLET | ORAL | Status: DC
Start: 2014-09-19 — End: 2015-08-24

## 2014-09-19 MED ORDER — BETAMETHASONE DIPROPIONATE 0.05 % EX OINT: TOPICAL_OINTMENT | Freq: Two times a day (BID) | CUTANEOUS | Status: DC

## 2014-09-19 NOTE — Patient Instructions (Signed)
Apply the ointment twice a day for 10 to 15 days Call if not improving    Check the  blood pressure  weekly  Be sure your blood pressure is between 110/65 and  145/85.  if it is consistently higher or lower, let me know
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
## 2014-09-19 NOTE — Progress Notes (Signed)
Pre visit review using our clinic review tool, if applicable. No additional management support is needed unless otherwise documented below in the visit note. 

## 2014-09-22 NOTE — Assessment & Plan Note (Signed)
BP slightly elevated today but usually within normal. Continue current meds and continue monitoring BPs at home

## 2014-09-22 NOTE — Assessment & Plan Note (Signed)
Migraines, Back to baseline, reaching for a Imitrex no more than 2 or 3 times a month. Refill Imitrex continue with all medications

## 2014-09-29 ENCOUNTER — Telehealth: Payer: Self-pay | Admitting: *Deleted

## 2014-09-29 NOTE — Telephone Encounter (Signed)
Pt dropped off FMLA forms to re-certify for intermittent leave due to migraines. Forms filled out as much as possible and forwarded to Dr. Larose Kells. JG//CMA

## 2014-10-02 NOTE — Telephone Encounter (Signed)
Completed forms faxed to [facility] successfully at [phone]. Originals placed up front for pt to pick up (pt aware) and copy sent for scanning. JG//CMA

## 2014-10-07 NOTE — Telephone Encounter (Signed)
Advised patient FMLA paperwork was faxed successfully patient states job never received, patient requesting re-fax and will pick up today. Thank you

## 2014-10-07 NOTE — Telephone Encounter (Signed)
Faxed again successfully.

## 2014-10-11 ENCOUNTER — Other Ambulatory Visit: Payer: Self-pay | Admitting: Internal Medicine

## 2014-10-13 ENCOUNTER — Other Ambulatory Visit: Payer: Self-pay

## 2014-10-13 ENCOUNTER — Other Ambulatory Visit: Payer: Self-pay | Admitting: Internal Medicine

## 2014-10-13 ENCOUNTER — Telehealth: Payer: Self-pay | Admitting: Internal Medicine

## 2014-10-13 NOTE — Telephone Encounter (Signed)
Caller name: Corey Costa Relationship to patient: self Can be reached: [phone] Pharmacy: [facility] on [address]  Reason for call: Pt needing refills on metoprolol and topamax. Pt is out of metoprolol. Has 2 left of topamax. Pharmacy told him they faxed request several times but I only see notifications from 10/11/14 for Topamax and 10/13/14 for Metoprolol.

## 2014-10-13 NOTE — Telephone Encounter (Signed)
Topamax was already refilled to [facility] this morning. His Metoprolol was refilled to [facility] on 08/19/2014 #90 tablets and 6 refills. Pt should have enough until November 2016. Pt will need to call pharmacy and speak directly with pharmacist or pharm tech for refills. Thank you.

## 2014-10-14 NOTE — Telephone Encounter (Signed)
Notified pt yesterday

## 2014-11-06 ENCOUNTER — Telehealth: Payer: Self-pay | Admitting: Internal Medicine

## 2014-11-06 NOTE — Telephone Encounter (Signed)
pre visit letter mailed 10/24/14

## 2014-11-13 ENCOUNTER — Telehealth: Payer: Self-pay | Admitting: Behavioral Health

## 2014-11-13 ENCOUNTER — Encounter: Payer: Self-pay | Admitting: Behavioral Health

## 2014-11-13 NOTE — Telephone Encounter (Signed)
Pre-Visit Call completed with patient and chart updated.   Pre-Visit Info documented in Specialty Comments under Snap[employer]ot.    

## 2014-11-14 ENCOUNTER — Ambulatory Visit (INDEPENDENT_AMBULATORY_CARE_PROVIDER_SITE_OTHER): Payer: 59 | Admitting: Internal Medicine

## 2014-11-14 ENCOUNTER — Encounter: Payer: Self-pay | Admitting: Internal Medicine

## 2014-11-14 VITALS — BP 122/76 | HR 67 | Temp 98.1°F | Ht 66.0 in | Wt 222.2 lb

## 2014-11-14 DIAGNOSIS — L309 Dermatitis, unspecified: Secondary | ICD-10-CM

## 2014-11-14 DIAGNOSIS — Z0000 Encounter for general adult medical examination without abnormal findings: Secondary | ICD-10-CM

## 2014-11-14 DIAGNOSIS — D72829 Elevated white blood cell count, unspecified: Secondary | ICD-10-CM

## 2014-11-14 MED ORDER — BETAMETHASONE DIPROPIONATE 0.05 % EX OINT: TOPICAL_OINTMENT | Freq: Two times a day (BID) | CUTANEOUS | Status: DC

## 2014-11-14 NOTE — Assessment & Plan Note (Addendum)
Td 2010 Pneumonia shot: Discussed, not interested at this point Never had a scope  Labs labs: CMP, FLP, CBC, TSH. HIV. Diet and exercise: Extensive discussion. He is actually doing better.

## 2014-11-14 NOTE — Progress Notes (Signed)
Pre visit review using our clinic review tool, if applicable. No additional management support is needed unless otherwise documented below in the visit note. 
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
## 2014-11-14 NOTE — Patient Instructions (Signed)
  Please schedule labs to be done within few days (fasting)  Check the  blood pressure 2 or 3 times a month    Be sure your blood pressure is between 110/65 and  145/85.  if it is consistently higher or lower, let me know   Next visit  for a physical in 1 year  Please schedule an appointment at the front desk Please come back fasting

## 2014-11-16 DIAGNOSIS — L309 Dermatitis, unspecified: Secondary | ICD-10-CM | POA: Insufficient documentation

## 2014-11-16 NOTE — Assessment & Plan Note (Signed)
Leukocytosis: Reports he already saw hematology, I don't see any reports, the patient will get the report to me or get the  name of the hematologists (likely seen @ [place]).

## 2014-11-16 NOTE — Assessment & Plan Note (Signed)
LE skin hyperpigmented lesions likely eczema. Refill betamethasone

## 2014-11-28 ENCOUNTER — Other Ambulatory Visit (INDEPENDENT_AMBULATORY_CARE_PROVIDER_SITE_OTHER): Payer: 59

## 2014-11-28 DIAGNOSIS — Z0000 Encounter for general adult medical examination without abnormal findings: Secondary | ICD-10-CM

## 2014-11-28 LAB — CBC WITH DIFFERENTIAL/PLATELET
Basophils Absolute: 0 10*3/uL (ref 0.0–0.1)
Basophils Relative: 0.5 % (ref 0.0–3.0)
Eosinophils Absolute: 0.3 10*3/uL (ref 0.0–0.7)
Eosinophils Relative: 3.2 % (ref 0.0–5.0)
HCT: 46.2 % (ref 39.0–52.0)
Hemoglobin: 15.1 g/dL (ref 13.0–17.0)
Lymphocytes Relative: 23.4 % (ref 12.0–46.0)
Lymphs Abs: 2.3 10*3/uL (ref 0.7–4.0)
MCHC: 32.6 g/dL (ref 30.0–36.0)
MCV: 91.8 fl (ref 78.0–100.0)
Monocytes Absolute: 0.9 10*3/uL (ref 0.1–1.0)
Monocytes Relative: 9.3 % (ref 3.0–12.0)
Neutro Abs: 6.1 10*3/uL (ref 1.4–7.7)
Neutrophils Relative %: 63.6 % (ref 43.0–77.0)
Platelets: 325 10*3/uL (ref 150.0–400.0)
RBC: 5.04 Mil/uL (ref 4.22–5.81)
RDW: 15.5 % (ref 11.5–15.5)
WBC: 9.6 10*3/uL (ref 4.0–10.5)

## 2014-11-28 LAB — COMPREHENSIVE METABOLIC PANEL
ALT: 37 U/L (ref 0–53)
AST: 22 U/L (ref 0–37)
Albumin: 4.2 g/dL (ref 3.5–5.2)
Alkaline Phosphatase: 89 U/L (ref 39–117)
BUN: 14 mg/dL (ref 6–23)
CO2: 27 mEq/L (ref 19–32)
Calcium: 9.4 mg/dL (ref 8.4–10.5)
Chloride: 109 mEq/L (ref 96–112)
Creatinine, Ser: 1.36 mg/dL (ref 0.40–1.50)
GFR: 74.73 mL/min (ref >60.00)
Glucose, Bld: 102 mg/dL — ABNORMAL HIGH (ref 70–99)
Potassium: 4.2 mEq/L (ref 3.5–5.1)
Sodium: 143 mEq/L (ref 135–145)
Total Bilirubin: 0.6 mg/dL (ref 0.2–1.2)
Total Protein: 7.7 g/dL (ref 6.0–8.3)

## 2014-11-28 LAB — LIPID PANEL
Cholesterol: 142 mg/dL (ref 0–200)
HDL: 39.1 mg/dL (ref >39.00)
LDL Cholesterol: 94 mg/dL (ref 0–99)
NonHDL: 102.41
Total CHOL/HDL Ratio: 4
Triglycerides: 44 mg/dL (ref 0.0–149.0)
VLDL: 8.8 mg/dL (ref 0.0–40.0)

## 2014-11-28 LAB — TSH: TSH: 2.69 u[IU]/mL (ref 0.35–4.50)

## 2014-11-29 LAB — HIV ANTIBODY (ROUTINE TESTING W REFLEX): HIV 1&2 Ab, 4th Generation: NONREACTIVE

## 2014-12-28 ENCOUNTER — Other Ambulatory Visit: Payer: Self-pay | Admitting: Internal Medicine

## 2015-01-20 ENCOUNTER — Institutional Professional Consult (permissible substitution): Payer: 59 | Admitting: Internal Medicine

## 2015-01-23 ENCOUNTER — Telehealth: Payer: Self-pay | Admitting: Internal Medicine

## 2015-01-23 ENCOUNTER — Institutional Professional Consult (permissible substitution): Payer: 59 | Admitting: Internal Medicine

## 2015-01-23 DIAGNOSIS — Z0289 Encounter for other administrative examinations: Secondary | ICD-10-CM

## 2015-01-23 NOTE — Telephone Encounter (Signed)
Patient received a emergency phone call regarding his mother and had to cancel appointment, patient did [facility] appointment to 01/30/15. Charge or no charge

## 2015-01-23 NOTE — Telephone Encounter (Signed)
Multiple cancellation history. Charge no show fee.

## 2015-01-30 ENCOUNTER — Encounter: Payer: Self-pay | Admitting: Internal Medicine

## 2015-01-30 ENCOUNTER — Ambulatory Visit (INDEPENDENT_AMBULATORY_CARE_PROVIDER_SITE_OTHER): Payer: 59 | Admitting: Internal Medicine

## 2015-01-30 DIAGNOSIS — Z09 Encounter for follow-up examination after completed treatment for conditions other than malignant neoplasm: Secondary | ICD-10-CM

## 2015-01-30 DIAGNOSIS — Z3009 Encounter for other general counseling and advice on contraception: Secondary | ICD-10-CM

## 2015-01-30 DIAGNOSIS — Z309 Encounter for contraceptive management, unspecified: Secondary | ICD-10-CM
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
## 2015-01-30 NOTE — Progress Notes (Signed)
Pre visit review using our clinic review tool, if applicable. No additional management support is needed unless otherwise documented below in the visit note. 

## 2015-01-30 NOTE — Patient Instructions (Signed)
We are referring you to a urologist  Consider using a calorie counting application called MYFITNESSPAL   Consider: Belviq Qsymia For wt losss

## 2015-02-01 DIAGNOSIS — Z09 Encounter for follow-up examination after completed treatment for conditions other than malignant neoplasm: Secondary | ICD-10-CM | POA: Insufficient documentation
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
## 2015-02-03 ENCOUNTER — Encounter: Payer: Self-pay | Admitting: Internal Medicine

## 2015-02-04 ENCOUNTER — Other Ambulatory Visit: Payer: Self-pay | Admitting: Internal Medicine

## 2015-02-19 ENCOUNTER — Encounter: Payer: Self-pay | Admitting: Medical

## 2015-02-19 ENCOUNTER — Ambulatory Visit (INDEPENDENT_AMBULATORY_CARE_PROVIDER_SITE_OTHER): Payer: 59 | Admitting: Medical

## 2015-02-19 ENCOUNTER — Ambulatory Visit (HOSPITAL_BASED_OUTPATIENT_CLINIC_OR_DEPARTMENT_OTHER)
Admission: RE | Admit: 2015-02-19 | Discharge: 2015-02-19 | Disposition: A | Discharge status: Home / Self Care | Payer: 59 | Source: Ambulatory Visit | Attending: Medical | Admitting: Medical

## 2015-02-19 VITALS — BP 128/88 | HR 67 | Temp 98.3°F | Ht 66.0 in | Wt 230.0 lb

## 2015-02-19 DIAGNOSIS — K649 Unspecified hemorrhoids: Secondary | ICD-10-CM

## 2015-02-19 DIAGNOSIS — R1084 Generalized abdominal pain: Secondary | ICD-10-CM | POA: Diagnosis not present

## 2015-02-19 DIAGNOSIS — R160 Hepatomegaly, not elsewhere classified: Secondary | ICD-10-CM | POA: Diagnosis not present

## 2015-02-19 DIAGNOSIS — K921 Melena: Secondary | ICD-10-CM | POA: Diagnosis not present

## 2015-02-19 DIAGNOSIS — R110 Nausea: Secondary | ICD-10-CM | POA: Insufficient documentation

## 2015-02-19 DIAGNOSIS — R197 Diarrhea, unspecified: Secondary | ICD-10-CM | POA: Insufficient documentation

## 2015-02-19 LAB — COMPREHENSIVE METABOLIC PANEL
ALT: 27 U/L (ref 0–53)
AST: 16 U/L (ref 0–37)
Albumin: 3.9 g/dL (ref 3.5–5.2)
Alkaline Phosphatase: 82 U/L (ref 39–117)
BUN: 13 mg/dL (ref 6–23)
CO2: 30 mEq/L (ref 19–32)
Calcium: 9 mg/dL (ref 8.4–10.5)
Chloride: 106 mEq/L (ref 96–112)
Creatinine, Ser: 1.38 mg/dL (ref 0.40–1.50)
GFR: 73.4 mL/min (ref >60.00)
Glucose, Bld: 74 mg/dL (ref 70–99)
Potassium: 3.8 mEq/L (ref 3.5–5.1)
Sodium: 143 mEq/L (ref 135–145)
Total Bilirubin: 0.6 mg/dL (ref 0.2–1.2)
Total Protein: 7.7 g/dL (ref 6.0–8.3)

## 2015-02-19 LAB — LIPASE: Lipase: 37 U/L (ref 11.0–59.0)

## 2015-02-19 LAB — CBC WITH DIFFERENTIAL/PLATELET
Basophils Absolute: 0 10*3/uL (ref 0.0–0.1)
Basophils Relative: 0.3 % (ref 0.0–3.0)
Eosinophils Absolute: 1.1 10*3/uL — ABNORMAL HIGH (ref 0.0–0.7)
Eosinophils Relative: 9.8 % — ABNORMAL HIGH (ref 0.0–5.0)
HCT: 44.5 % (ref 39.0–52.0)
Hemoglobin: 14.6 g/dL (ref 13.0–17.0)
Lymphocytes Relative: 14.6 % (ref 12.0–46.0)
Lymphs Abs: 1.6 10*3/uL (ref 0.7–4.0)
MCHC: 32.7 g/dL (ref 30.0–36.0)
MCV: 92.2 fl (ref 78.0–100.0)
Monocytes Absolute: 1.8 10*3/uL — ABNORMAL HIGH (ref 0.1–1.0)
Monocytes Relative: 16.4 % — ABNORMAL HIGH (ref 3.0–12.0)
Neutro Abs: 6.6 10*3/uL (ref 1.4–7.7)
Neutrophils Relative %: 58.9 % (ref 43.0–77.0)
Platelets: 336 10*3/uL (ref 150.0–400.0)
RBC: 4.82 Mil/uL (ref 4.22–5.81)
RDW: 14.6 % (ref 11.5–15.5)
WBC: 11.1 10*3/uL — ABNORMAL HIGH (ref 4.0–10.5)

## 2015-02-19 LAB — AMYLASE: Amylase: 42 U/L (ref 27–131)

## 2015-02-19 MED ORDER — METRONIDAZOLE 500 MG PO TABS: 500.0000 mg | ORAL_TABLET | Freq: Three times a day (TID) | ORAL | Status: DC

## 2015-02-19 MED ORDER — HYDROCORTISONE ACETATE 25 MG RE SUPP: 25.0000 mg | Freq: Two times a day (BID) | RECTAL | Status: DC

## 2015-02-19 MED ORDER — RANITIDINE HCL 150 MG PO CAPS: 150.0000 mg | ORAL_CAPSULE | Freq: Two times a day (BID) | ORAL | Status: DC

## 2015-02-19 MED ORDER — CIPROFLOXACIN HCL 500 MG PO TABS: 500.0000 mg | ORAL_TABLET | Freq: Two times a day (BID) | ORAL | Status: DC

## 2015-02-19 NOTE — Patient Instructions (Addendum)
Will get cbc, cmp, amylase and lipase stat. To evaluate abdomen pain. No alcohol use for next 2 wks at least.  Will get abdomen ultrasound due to pain increase after eating.   Rx ranitidine. Healthy diet advised.  For hemorrhoid. Rx annusol hc suppository.  Will rx flagyl and cipro to start over weekend if pain location change llq.  If pain worsens over weekend the ED evaluation.  Will go ahead and refer to GI.  Follow up on Monday with me.
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
## 2015-02-19 NOTE — Progress Notes (Signed)
Pre visit review using our clinic review tool, if applicable. No additional management support is needed unless otherwise documented below in the visit note. 

## 2015-02-20 ENCOUNTER — Other Ambulatory Visit (INDEPENDENT_AMBULATORY_CARE_PROVIDER_SITE_OTHER): Payer: 59

## 2015-02-20 ENCOUNTER — Telehealth: Payer: Self-pay | Admitting: Internal Medicine

## 2015-02-20 ENCOUNTER — Ambulatory Visit: Payer: 59 | Admitting: Internal Medicine

## 2015-02-20 DIAGNOSIS — K921 Melena: Secondary | ICD-10-CM

## 2015-02-20 NOTE — Telephone Encounter (Signed)
Patient has been made aware.

## 2015-02-20 NOTE — Telephone Encounter (Signed)
Caller name: Ja-Kwan   Relationship to patient: Self   Can be reached: [phone]  Reason for call: Pt is returning your call for lab results

## 2015-02-23 ENCOUNTER — Ambulatory Visit (INDEPENDENT_AMBULATORY_CARE_PROVIDER_SITE_OTHER): Payer: 59 | Admitting: Medical

## 2015-02-23 ENCOUNTER — Ambulatory Visit: Payer: 59 | Admitting: Internal Medicine

## 2015-02-23 ENCOUNTER — Encounter: Payer: Self-pay | Admitting: Medical

## 2015-02-23 ENCOUNTER — Encounter (INDEPENDENT_AMBULATORY_CARE_PROVIDER_SITE_OTHER): Payer: Self-pay

## 2015-02-23 VITALS — BP 126/86 | HR 87 | Temp 98.1°F | Ht 66.0 in | Wt 227.0 lb

## 2015-02-23 DIAGNOSIS — D72829 Elevated white blood cell count, unspecified: Secondary | ICD-10-CM

## 2015-02-23 DIAGNOSIS — R1084 Generalized abdominal pain: Secondary | ICD-10-CM | POA: Diagnosis not present

## 2015-02-23 LAB — H. PYLORI BREATH TEST: H. pylori Breath Test: NOT DETECTED

## 2015-02-23 NOTE — Patient Instructions (Addendum)
Pt feels like pain is much improved a lot. Now mostly only transient  when passing stool.  Advise to continue ranitidine. Will get cbc today to check wbc which was elevated mildly.  If your pain gets worse again pending GI referral then  let me know  since it may be beneficial to put you on antibiotic in that event(such as cipro and flagyl). May do this if wbc is increased as well.  Call the GI/return there.  Follow up with [place] as needed. If appointment with Gi past 10 days from today let us know.

## 2015-02-23 NOTE — Progress Notes (Signed)
Pre visit review using our clinic review tool, if applicable. No additional management support is needed unless otherwise documented below in the visit note. 
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
## 2015-02-24 LAB — CBC WITH DIFFERENTIAL/PLATELET
Basophils Absolute: 0 10*3/uL (ref 0.0–0.1)
Basophils Relative: 0.3 % (ref 0.0–3.0)
Eosinophils Absolute: 1.4 10*3/uL — ABNORMAL HIGH (ref 0.0–0.7)
Eosinophils Relative: 11.1 % — ABNORMAL HIGH (ref 0.0–5.0)
HCT: 42.8 % (ref 39.0–52.0)
Hemoglobin: 13.7 g/dL (ref 13.0–17.0)
Lymphocytes Relative: 14.1 % (ref 12.0–46.0)
Lymphs Abs: 1.7 10*3/uL (ref 0.7–4.0)
MCHC: 32.1 g/dL (ref 30.0–36.0)
MCV: 93.5 fl (ref 78.0–100.0)
Monocytes Absolute: 1.6 10*3/uL — ABNORMAL HIGH (ref 0.1–1.0)
Monocytes Relative: 12.7 % — ABNORMAL HIGH (ref 3.0–12.0)
Neutro Abs: 7.6 10*3/uL (ref 1.4–7.7)
Neutrophils Relative %: 61.8 % (ref 43.0–77.0)
Platelets: 334 10*3/uL (ref 150.0–400.0)
RBC: 4.58 Mil/uL (ref 4.22–5.81)
RDW: 14.7 % (ref 11.5–15.5)
WBC: 12.3 10*3/uL — ABNORMAL HIGH (ref 4.0–10.5)

## 2015-03-11 ENCOUNTER — Telehealth: Payer: Self-pay | Admitting: Medical

## 2015-03-11 NOTE — Telephone Encounter (Signed)
Caller name: Delories Heinz Relationship to patient: wife Can be reached: [phone]  Reason for call: Pt has been taking meds, eating a bland diet, had [place], and is still passing red blood. Wife is concerned that there is no improvement and wanting call from provider. They did not want ER visit or additional bills. They have GI appt 04/28/15. Please call to advise.

## 2015-03-11 NOTE — Telephone Encounter (Signed)
Edward please advise on note below.

## 2015-03-11 NOTE — Telephone Encounter (Signed)
Patient calling back checking on the status of below message best [phone]

## 2015-03-11 NOTE — Telephone Encounter (Signed)
Pt abdomen discomfort about the same. He states he has still passing bright red blood from rectum at times(moderate to a lot this am). Some fatigue but he states new job. He expressed he did not want to go to ED but he would if is was necessary. If offered to see him tomorrow at 2:15 tomorrow  since I am full today and tomorrow am. I mentioned that based on exam tomorrow and clinical presentation might send to ED(if worsens today then go to ED). He had concern that I would see me tomorrow and then go to ED. So he stated he would just go ahead and go to the ED. I had put a referral to Gi in and he states that appointment was for February. I will send this note to referral staff to see if they can expidite his referral.(asap)

## 2015-03-12 NOTE — Telephone Encounter (Signed)
GI will see the patient tomorrow 03-13-2015 with Dr Ardis Hughs at 1030  Have left messages for the patient and he will get a my chart message

## 2015-03-13 ENCOUNTER — Other Ambulatory Visit (INDEPENDENT_AMBULATORY_CARE_PROVIDER_SITE_OTHER): Payer: 59

## 2015-03-13 ENCOUNTER — Encounter: Payer: Self-pay | Admitting: Gastroenterology

## 2015-03-13 ENCOUNTER — Ambulatory Visit (INDEPENDENT_AMBULATORY_CARE_PROVIDER_SITE_OTHER): Payer: 59 | Admitting: Gastroenterology

## 2015-03-13 ENCOUNTER — Telehealth: Payer: Self-pay | Admitting: Gastroenterology

## 2015-03-13 VITALS — BP 130/86 | HR 72 | Ht 66.0 in | Wt 221.0 lb

## 2015-03-13 DIAGNOSIS — A09 Infectious gastroenteritis and colitis, unspecified: Secondary | ICD-10-CM | POA: Diagnosis not present

## 2015-03-13 DIAGNOSIS — R197 Diarrhea, unspecified: Secondary | ICD-10-CM

## 2015-03-13 LAB — CBC WITH DIFFERENTIAL/PLATELET
Basophils Absolute: 0 10*3/uL (ref 0.0–0.1)
Basophils Relative: 0.4 % (ref 0.0–3.0)
Eosinophils Absolute: 2.2 10*3/uL — ABNORMAL HIGH (ref 0.0–0.7)
Eosinophils Relative: 18.1 % — ABNORMAL HIGH (ref 0.0–5.0)
HCT: 39 % (ref 39.0–52.0)
Hemoglobin: 12.7 g/dL — ABNORMAL LOW (ref 13.0–17.0)
Lymphocytes Relative: 13.8 % (ref 12.0–46.0)
Lymphs Abs: 1.7 10*3/uL (ref 0.7–4.0)
MCHC: 32.7 g/dL (ref 30.0–36.0)
MCV: 92.4 fl (ref 78.0–100.0)
Monocytes Absolute: 1.8 10*3/uL — ABNORMAL HIGH (ref 0.1–1.0)
Monocytes Relative: 15 % — ABNORMAL HIGH (ref 3.0–12.0)
Neutro Abs: 6.4 10*3/uL (ref 1.4–7.7)
Neutrophils Relative %: 52.7 % (ref 43.0–77.0)
Platelets: 457 10*3/uL — ABNORMAL HIGH (ref 150.0–400.0)
RBC: 4.22 Mil/uL (ref 4.22–5.81)
RDW: 15 % (ref 11.5–15.5)
WBC: 12.2 10*3/uL — ABNORMAL HIGH (ref 4.0–10.5)

## 2015-03-13 LAB — HIGH SENSITIVITY CRP: CRP, High Sensitivity: 33.68 mg/L — ABNORMAL HIGH (ref 0.000–5.000)

## 2015-03-13 LAB — SEDIMENTATION RATE: Sed Rate: 58 mm/hr — ABNORMAL HIGH (ref 0–22)

## 2015-03-13 MED ORDER — PREDNISONE 10 MG PO TABS: 20.0000 mg | ORAL_TABLET | Freq: Two times a day (BID) | ORAL | Status: DC

## 2015-03-13 MED ORDER — NA SULFATE-K SULFATE-MG SULF 17.5-3.13-1.6 GM/177ML PO SOLN: 1.0000 | Freq: Once | ORAL | Status: DC
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
## 2015-03-13 NOTE — Patient Instructions (Addendum)
You will have labs checked today in the basement lab.  Please head down after you check out with the front desk  (cbc, esr, crp). You will be set up for a colonoscopy (on Tuesday Dec 27th). Prednisone has been sent to your pharmacy take 20 mg twice daily.

## 2015-03-17 ENCOUNTER — Ambulatory Visit (AMBULATORY_SURGERY_CENTER): Payer: 59 | Admitting: Gastroenterology

## 2015-03-17 ENCOUNTER — Encounter: Payer: Self-pay | Admitting: Gastroenterology

## 2015-03-17 ENCOUNTER — Encounter: Payer: 59 | Admitting: Gastroenterology

## 2015-03-17 VITALS — BP 138/85 | HR 68 | Temp 98.2°F | Resp 22 | Ht 66.0 in | Wt 221.0 lb

## 2015-03-17 DIAGNOSIS — A09 Infectious gastroenteritis and colitis, unspecified: Secondary | ICD-10-CM | POA: Diagnosis not present

## 2015-03-17 DIAGNOSIS — R197 Diarrhea, unspecified: Secondary | ICD-10-CM | POA: Diagnosis not present

## 2015-03-17 DIAGNOSIS — K633 Ulcer of intestine: Secondary | ICD-10-CM | POA: Diagnosis not present

## 2015-03-17 DIAGNOSIS — K529 Noninfective gastroenteritis and colitis, unspecified: Secondary | ICD-10-CM | POA: Diagnosis not present

## 2015-03-17 MED ORDER — SODIUM CHLORIDE 0.9 % IV SOLN: 500.0000 mL | INTRAVENOUS | Status: DC

## 2015-03-17 NOTE — Progress Notes (Signed)
Called to room to assist during endoscopic procedure.  Patient ID and intended procedure confirmed with present staff. Received instructions for my participation in the procedure from the performing physician.  

## 2015-03-17 NOTE — Telephone Encounter (Addendum)
Pt was called and he states that he did pick up the prep and is prepared for the procedure today

## 2015-03-17 NOTE — Op Note (Signed)
[facility]  [address]. [place], [id number]   COLONOSCOPY PROCEDURE REPORT  PATIENT: Corey Costa, Corey Costa  MR#: [id number] BIRTHDATE: [birth year] , 39  yrs. old GENDER: male ENDOSCOPIST: Milus Banister, MD REFERRED EL:YHTM Larose Kells, M.D. PROCEDURE DATE:  03/17/2015 PROCEDURE:   Colonoscopy, diagnostic and Colonoscopy with biopsy First Screening Colonoscopy - Avg.  risk and is 50 yrs.  old or older - No.  Prior Negative Screening - Now for repeat screening. N/A  History of Adenoma - Now for follow-up colonoscopy & has been > or = to 3 yrs.  N/A  Recommend repeat exam, <10 yrs? No ASA CLASS:   Class II INDICATIONS:bloody diarrhea, abd pain, elevated inflammatory markers. MEDICATIONS: Monitored anesthesia care and Propofol 220 mg IV  DESCRIPTION OF PROCEDURE:   After the risks benefits and alternatives of the procedure were thoroughly explained, informed consent was obtained.  The digital rectal exam revealed no abnormalities of the rectum.   The LB [id number]  endoscope was introduced through the anus and advanced to the terminal ileum which was intubated for a short distance. No adverse events experienced.   The quality of the prep was excellent.  The instrument was then slowly withdrawn as the colon was fully examined. Estimated blood loss is zero unless otherwise noted in this procedure report.  COLON FINDINGS: There was moderate to severe inflammation from the anus up to the hepatic flexure.  The inflammation was confluent, circumferential, most consistent with ulcerative colitis.  The ascending colon and cecum were normal appearing.  The terminal ileum contained a few small erosions but was otherwise normal. Biopsies were taken from terminal ileum, right colon and left colon and sent in separate jars to pathology.  Retroflexed views revealed no abnormalities. The time to cecum = 1.7 Withdrawal time = 7.2 The scope was withdrawn and the procedure  completed. COMPLICATIONS: There were no immediate complications.  ENDOSCOPIC IMPRESSION: There was moderate to severe inflammation from the anus up to the hepatic flexure.  The inflammation was confluent, circumferential, most consistent with ulcerative colitis.  The ascending colon and cecum were normal appearing.  The terminal ileum contained a few small erosions but was otherwise normal.  Biopsies were taken from terminal ileum, right colon and left colon and sent in separate jars to pathology  RECOMMENDATIONS: Please continue prednisone 29m twice daily.  Return office appt to see Dr.  JArdis Hughsor extender in 3-4 weeks.  If clinicaly much improved then will start full dose oral mesalamine and begin to slowly taper the prednisone.  Await final pathology results.  If any suspicion for Crohn's then will likely proceed with imaging (MR enterography) as well.  eSigned:  DMilus Banister MD 03/17/2015 1:39 PM

## 2015-03-17 NOTE — Patient Instructions (Signed)
YOU HAD AN ENDOSCOPIC PROCEDURE TODAY AT [facility]:   Refer to the procedure report that was given to you for any specific questions about what was found during the examination.  If the procedure report does not answer your questions, please call your gastroenterologist to clarify.  If you requested that your care partner not be given the details of your procedure findings, then the procedure report has been included in a sealed envelope for you to review at your convenience later.  YOU SHOULD EXPECT: Some feelings of bloating in the abdomen. Passage of more gas than usual.  Walking can help get rid of the air that was put into your GI tract during the procedure and reduce the bloating. If you had a lower endoscopy (such as a colonoscopy or flexible sigmoidoscopy) you may notice spotting of blood in your stool or on the toilet paper. If you underwent a bowel prep for your procedure, you may not have a normal bowel movement for a few days.  Please Note:  You might notice some irritation and congestion in your nose or some drainage.  This is from the oxygen used during your procedure.  There is no need for concern and it should clear up in a day or so.  SYMPTOMS TO REPORT IMMEDIATELY:   Following lower endoscopy (colonoscopy or flexible sigmoidoscopy):  Excessive amounts of blood in the stool  Significant tenderness or worsening of abdominal pains  Swelling of the abdomen that is new, acute  Fever of 100F or higher   Following upper endoscopy (EGD)  Vomiting of blood or coffee ground material  New chest pain or pain under the shoulder blades  Painful or persistently difficult swallowing  New shortness of breath  Fever of 100F or higher  Black, tarry-looking stools  For urgent or emergent issues, a gastroenterologist can be reached at any hour by calling [phone].   DIET: Your first meal following the procedure should be a small meal and then it is ok to progress to  your normal diet. Heavy or fried foods are harder to digest and may make you feel nauseous or bloated.  Likewise, meals heavy in dairy and vegetables can increase bloating.  Drink plenty of fluids but you should avoid alcoholic beverages for 24 hours.  ACTIVITY:  You should plan to take it easy for the rest of today and you should NOT DRIVE or use heavy machinery until tomorrow (because of the sedation medicines used during the test).    FOLLOW UP: Our staff will call the number listed on your records the next business day following your procedure to check on you and address any questions or concerns that you may have regarding the information given to you following your procedure. If we do not reach you, we will leave a message.  However, if you are feeling well and you are not experiencing any problems, there is no need to return our call.  We will assume that you have returned to your regular daily activities without incident.  If any biopsies were taken you will be contacted by phone or by letter within the next 1-3 weeks.  Please call us at [phone] if you have not heard about the biopsies in 3 weeks.    SIGNATURES/CONFIDENTIALITY: You and/or your care partner have signed paperwork which will be entered into your electronic medical record.  These signatures attest to the fact that that the information above on your After Visit Summary has been reviewed  and is understood.  Full responsibility of the confidentiality of this discharge information lies with you and/or your care-partner.  Continue prednisone 20 mg. Twice daily.  See Dr. Ardis Hughs or extender in 3-4 weeks.    Review RECOMMENDATIONS at bottom of procedure report.

## 2015-03-17 NOTE — Progress Notes (Signed)
Report to PACU, RN, vss, BBS= Clear.  

## 2015-03-18 ENCOUNTER — Telehealth: Payer: Self-pay | Admitting: *Deleted

## 2015-03-18 NOTE — Telephone Encounter (Signed)
  Follow up Call-  Call back number 03/17/2015  Post procedure Call Back phone  # [phone]  Permission to leave phone message Yes     Patient questions:  Do you have a fever, pain , or abdominal swelling? No. Pain Score  0 *  Have you tolerated food without any problems? Yes.    Have you been able to return to your normal activities? Yes.    Do you have any questions about your discharge instructions: Diet   No. Medications  No. Follow up visit  No.  Do you have questions or concerns about your Care? No.  Actions: * If pain score is 4 or above: No action needed, pain <4.

## 2015-04-17 ENCOUNTER — Ambulatory Visit: Payer: 59 | Admitting: Physician Assistant

## 2015-04-25 ENCOUNTER — Other Ambulatory Visit: Payer: Self-pay | Admitting: Gastroenterology

## 2015-04-28 ENCOUNTER — Telehealth: Payer: Self-pay | Admitting: Gastroenterology

## 2015-04-28 ENCOUNTER — Other Ambulatory Visit: Payer: Self-pay

## 2015-04-28 ENCOUNTER — Ambulatory Visit: Payer: 59 | Admitting: Gastroenterology

## 2015-04-28 MED ORDER — PREDNISONE 10 MG PO TABS: ORAL_TABLET | ORAL | Status: DC

## 2015-04-28 NOTE — Telephone Encounter (Signed)
Pt has refills to last until appt.

## 2015-06-22 ENCOUNTER — Ambulatory Visit (INDEPENDENT_AMBULATORY_CARE_PROVIDER_SITE_OTHER): Payer: BLUE CROSS/BLUE SHIELD | Admitting: Gastroenterology

## 2015-06-22 ENCOUNTER — Encounter: Payer: Self-pay | Admitting: Gastroenterology

## 2015-06-22 VITALS — BP 150/78 | HR 72 | Ht 66.0 in | Wt 219.0 lb

## 2015-06-22 DIAGNOSIS — K5100 Ulcerative (chronic) pancolitis without complications: Secondary | ICD-10-CM | POA: Diagnosis not present

## 2015-06-22 MED ORDER — MESALAMINE 1.2 G PO TBEC: 4.8000 g | DELAYED_RELEASE_TABLET | Freq: Every day | ORAL | Status: DC
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
## 2015-06-22 NOTE — Patient Instructions (Addendum)
Start lialda 4 pills once daily.  (1 month with 11 refills), coupon provided. Please call if this is too expensive (oiut of pocket). For UC. Taper the prednisone by 104m per week. When you get down to 158mper week, please then complete 75m31mor a week.  Please return to see Dr. JacArdis Hughs 2 months ( 08/24/15 at 9;45AM) sooner if needed.

## 2015-07-21 ENCOUNTER — Encounter (HOSPITAL_COMMUNITY): Payer: Self-pay | Admitting: Emergency Medicine

## 2015-07-21 ENCOUNTER — Inpatient Hospital Stay (HOSPITAL_COMMUNITY)
Admission: EM | Admit: 2015-07-21 | Discharge: 2015-07-24 | DRG: 386 | Disposition: A | Discharge status: Home / Self Care | Payer: BLUE CROSS/BLUE SHIELD | Attending: Internal Medicine | Admitting: Internal Medicine

## 2015-07-21 DIAGNOSIS — K51511 Left sided colitis with rectal bleeding: Secondary | ICD-10-CM | POA: Diagnosis not present

## 2015-07-21 DIAGNOSIS — K51911 Ulcerative colitis, unspecified with rectal bleeding: Secondary | ICD-10-CM

## 2015-07-21 DIAGNOSIS — I10 Essential (primary) hypertension: Secondary | ICD-10-CM | POA: Diagnosis present

## 2015-07-21 DIAGNOSIS — Z8249 Family history of ischemic heart disease and other diseases of the circulatory system: Secondary | ICD-10-CM

## 2015-07-21 DIAGNOSIS — N179 Acute kidney failure, unspecified: Secondary | ICD-10-CM

## 2015-07-21 DIAGNOSIS — Z7952 Long term (current) use of systemic steroids: Secondary | ICD-10-CM

## 2015-07-21 DIAGNOSIS — R1032 Left lower quadrant pain: Secondary | ICD-10-CM | POA: Diagnosis not present

## 2015-07-21 DIAGNOSIS — Y929 Unspecified place or not applicable: Secondary | ICD-10-CM

## 2015-07-21 DIAGNOSIS — E876 Hypokalemia: Secondary | ICD-10-CM | POA: Diagnosis present

## 2015-07-21 DIAGNOSIS — Z79899 Other long term (current) drug therapy: Secondary | ICD-10-CM

## 2015-07-21 DIAGNOSIS — K5190 Ulcerative colitis, unspecified, without complications: Secondary | ICD-10-CM | POA: Diagnosis present

## 2015-07-21 DIAGNOSIS — T380X5A Adverse effect of glucocorticoids and synthetic analogues, initial encounter: Secondary | ICD-10-CM | POA: Diagnosis present

## 2015-07-21 DIAGNOSIS — D72829 Elevated white blood cell count, unspecified: Secondary | ICD-10-CM | POA: Diagnosis present

## 2015-07-21 HISTORY — DX: Ulcerative colitis, unspecified, without complications: K51.90

## 2015-07-21 LAB — CBC
HCT: 38.6 % — ABNORMAL LOW (ref 39.0–52.0)
Hemoglobin: 13.2 g/dL (ref 13.0–17.0)
MCH: 29.3 pg (ref 26.0–34.0)
MCHC: 34.2 g/dL (ref 30.0–36.0)
MCV: 85.6 fL (ref 78.0–100.0)
Platelets: 483 10*3/uL — ABNORMAL HIGH (ref 150–400)
RBC: 4.51 MIL/uL (ref 4.22–5.81)
RDW: 14.7 % (ref 11.5–15.5)
WBC: 18.9 10*3/uL — ABNORMAL HIGH (ref 4.0–10.5)

## 2015-07-21 LAB — COMPREHENSIVE METABOLIC PANEL
ALT: 20 U/L (ref 17–63)
AST: 17 U/L (ref 15–41)
Albumin: 3.9 g/dL (ref 3.5–5.0)
Alkaline Phosphatase: 65 U/L (ref 38–126)
Anion gap: 10 (ref 5–15)
BUN: 10 mg/dL (ref 6–20)
CO2: 26 mmol/L (ref 22–32)
Calcium: 9 mg/dL (ref 8.9–10.3)
Chloride: 105 mmol/L (ref 101–111)
Creatinine, Ser: 1.32 mg/dL — ABNORMAL HIGH (ref 0.61–1.24)
GFR calc Af Amer: >60 mL/min (ref >60)
GFR calc non Af Amer: >60 mL/min (ref >60)
Glucose, Bld: 113 mg/dL — ABNORMAL HIGH (ref 65–99)
Potassium: 3.4 mmol/L — ABNORMAL LOW (ref 3.5–5.1)
Sodium: 141 mmol/L (ref 135–145)
Total Bilirubin: 0.9 mg/dL (ref 0.3–1.2)
Total Protein: 7.8 g/dL (ref 6.5–8.1)

## 2015-07-21 LAB — ABO/RH: ABO/RH(D): O POS

## 2015-07-21 LAB — TYPE AND SCREEN
ABO/RH(D): O POS
Antibody Screen: NEGATIVE

## 2015-07-21 MED ORDER — ONDANSETRON HCL 4 MG/2ML IJ SOLN
4.0000 mg | Freq: Once | INTRAMUSCULAR | Status: AC
  Administered 2015-07-21: 4 mg via INTRAVENOUS
  Filled 2015-07-21: qty 2

## 2015-07-21 MED ORDER — METHYLPREDNISOLONE SODIUM SUCC 125 MG IJ SOLR
125.0000 mg | Freq: Once | INTRAMUSCULAR | Status: AC
  Administered 2015-07-22: 125 mg via INTRAVENOUS
  Filled 2015-07-21: qty 2
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
## 2015-07-21 NOTE — ED Notes (Signed)
Pt states he has ulcerative colitis and for the past 3 days he has been having rectal bleeding  Pt states the bleeding has become worse today  States he has had multiple episodes today  States the blood is bright red in color  Pt states he has abd cramping

## 2015-07-22 DIAGNOSIS — T380X5A Adverse effect of glucocorticoids and synthetic analogues, initial encounter: Secondary | ICD-10-CM | POA: Diagnosis present

## 2015-07-22 DIAGNOSIS — K51911 Ulcerative colitis, unspecified with rectal bleeding: Secondary | ICD-10-CM | POA: Diagnosis present

## 2015-07-22 DIAGNOSIS — K51511 Left sided colitis with rectal bleeding: Secondary | ICD-10-CM | POA: Diagnosis present

## 2015-07-22 DIAGNOSIS — E876 Hypokalemia: Secondary | ICD-10-CM

## 2015-07-22 DIAGNOSIS — I10 Essential (primary) hypertension: Secondary | ICD-10-CM

## 2015-07-22 DIAGNOSIS — D72829 Elevated white blood cell count, unspecified: Secondary | ICD-10-CM

## 2015-07-22 DIAGNOSIS — Y929 Unspecified place or not applicable: Secondary | ICD-10-CM | POA: Diagnosis not present

## 2015-07-22 DIAGNOSIS — Z8249 Family history of ischemic heart disease and other diseases of the circulatory system: Secondary | ICD-10-CM | POA: Diagnosis not present

## 2015-07-22 DIAGNOSIS — N179 Acute kidney failure, unspecified: Secondary | ICD-10-CM

## 2015-07-22 DIAGNOSIS — K5190 Ulcerative colitis, unspecified, without complications: Secondary | ICD-10-CM | POA: Diagnosis present

## 2015-07-22 DIAGNOSIS — Z79899 Other long term (current) drug therapy: Secondary | ICD-10-CM | POA: Diagnosis not present

## 2015-07-22 DIAGNOSIS — Z7952 Long term (current) use of systemic steroids: Secondary | ICD-10-CM | POA: Diagnosis not present

## 2015-07-22 DIAGNOSIS — R1032 Left lower quadrant pain: Secondary | ICD-10-CM | POA: Diagnosis present

## 2015-07-22 LAB — BASIC METABOLIC PANEL
Anion gap: 11 (ref 5–15)
BUN: 9 mg/dL (ref 6–20)
CO2: 24 mmol/L (ref 22–32)
Calcium: 8.2 mg/dL — ABNORMAL LOW (ref 8.9–10.3)
Chloride: 107 mmol/L (ref 101–111)
Creatinine, Ser: 1.1 mg/dL (ref 0.61–1.24)
GFR calc Af Amer: >60 mL/min (ref >60)
GFR calc non Af Amer: >60 mL/min (ref >60)
Glucose, Bld: 147 mg/dL — ABNORMAL HIGH (ref 65–99)
Potassium: 3.7 mmol/L (ref 3.5–5.1)
Sodium: 142 mmol/L (ref 135–145)

## 2015-07-22 LAB — CBC
HCT: 36.2 % — ABNORMAL LOW (ref 39.0–52.0)
Hemoglobin: 11.9 g/dL — ABNORMAL LOW (ref 13.0–17.0)
MCH: 28.5 pg (ref 26.0–34.0)
MCHC: 32.9 g/dL (ref 30.0–36.0)
MCV: 86.8 fL (ref 78.0–100.0)
Platelets: 449 10*3/uL — ABNORMAL HIGH (ref 150–400)
RBC: 4.17 MIL/uL — ABNORMAL LOW (ref 4.22–5.81)
RDW: 14.9 % (ref 11.5–15.5)
WBC: 12.5 10*3/uL — ABNORMAL HIGH (ref 4.0–10.5)

## 2015-07-22 LAB — C DIFFICILE QUICK SCREEN W PCR REFLEX
C Diff antigen: NEGATIVE
C Diff interpretation: NEGATIVE
C Diff toxin: NEGATIVE

## 2015-07-22 MED ORDER — SODIUM CHLORIDE 0.9 % IV SOLN
INTRAVENOUS | Status: DC
  Administered 2015-07-22 – 2015-07-24 (×4): via INTRAVENOUS

## 2015-07-22 MED ORDER — POTASSIUM CHLORIDE CRYS ER 20 MEQ PO TBCR
40.0000 meq | EXTENDED_RELEASE_TABLET | Freq: Once | ORAL | Status: AC
  Administered 2015-07-22: 40 meq via ORAL
  Filled 2015-07-22: qty 2

## 2015-07-22 MED ORDER — MORPHINE SULFATE (PF) 4 MG/ML IV SOLN: 4.0000 mg | INTRAVENOUS | Status: DC | PRN

## 2015-07-22 MED ORDER — MESALAMINE 1.2 G PO TBEC
4.8000 g | DELAYED_RELEASE_TABLET | Freq: Every day | ORAL | Status: DC
  Administered 2015-07-22 – 2015-07-24 (×3): 4.8 g via ORAL
  Filled 2015-07-22 (×5): qty 4

## 2015-07-22 MED ORDER — PREDNISONE 20 MG PO TABS
40.0000 mg | ORAL_TABLET | Freq: Every day | ORAL | Status: DC
  Administered 2015-07-23 – 2015-07-24 (×2): 40 mg via ORAL
  Filled 2015-07-22 (×3): qty 2

## 2015-07-22 MED ORDER — METOPROLOL TARTRATE 25 MG PO TABS
25.0000 mg | ORAL_TABLET | Freq: Two times a day (BID) | ORAL | Status: DC
  Administered 2015-07-22 – 2015-07-24 (×5): 25 mg via ORAL
  Filled 2015-07-22 (×6): qty 1

## 2015-07-22 MED ORDER — METHYLPREDNISOLONE SODIUM SUCC 40 MG IJ SOLR: 40.0000 mg | Freq: Every day | INTRAMUSCULAR | Status: DC

## 2015-07-22 MED ORDER — ACETAMINOPHEN 650 MG RE SUPP: 650.0000 mg | Freq: Four times a day (QID) | RECTAL | Status: DC | PRN

## 2015-07-22 MED ORDER — MORPHINE SULFATE (PF) 4 MG/ML IV SOLN
4.0000 mg | Freq: Once | INTRAVENOUS | Status: AC
  Administered 2015-07-22: 4 mg via INTRAVENOUS
  Filled 2015-07-22: qty 1

## 2015-07-22 MED ORDER — ACETAMINOPHEN 325 MG PO TABS: 650.0000 mg | ORAL_TABLET | Freq: Four times a day (QID) | ORAL | Status: DC | PRN

## 2015-07-22 MED ORDER — METHYLPREDNISOLONE SODIUM SUCC 125 MG IJ SOLR
60.0000 mg | Freq: Four times a day (QID) | INTRAMUSCULAR | Status: DC
  Administered 2015-07-22 (×2): 60 mg via INTRAVENOUS
  Filled 2015-07-22 (×5): qty 0.96

## 2015-07-22 MED ORDER — HYDRALAZINE HCL 20 MG/ML IJ SOLN: 20.0000 mg | Freq: Four times a day (QID) | INTRAMUSCULAR | Status: DC | PRN

## 2015-07-22 MED ORDER — OXYCODONE HCL 5 MG PO TABS
5.0000 mg | ORAL_TABLET | ORAL | Status: DC | PRN
  Administered 2015-07-23: 5 mg via ORAL
  Filled 2015-07-22: qty 1

## 2015-07-22 NOTE — Progress Notes (Signed)
PROGRESS NOTE    Corey Costa  AFB:[id number] DOB: 1976/03/09 DOA: 07/21/2015  PCP: [name], MD  Outpatient Specialists:  Dr Ardis Hughs, GI    Brief Narrative:  Corey Costa is a 40 y.o. gentleman with a history of HTN, asthma, and ulcerative colitis, just diagnosed in January 2017, who presents for evaluation of intractable left lower quadrant abdominal pain. He has had bloody diarrhea, palpitations, light-headedness, nausea and vomiting. He reports that he had actually tapered off of oral steroids approximately two weeks ago, but he has been back on prednisone 12m daily for the past week due to recurrent symptoms.   Assessment & Plan:   Principal Problem:   Ulcerative colitis with rectal bleeding C- olonoscopy Dr. JArdis Hughs12 2016 found normal terminal ileum, normal appearing right colon, moderate to severe inflammation from the anus to about the hepatic flexure. Biopsies of terminal ileum were normal, biopsies of right colon showed chronic colitis, biopsies of left colon showed chronic active colitis. - GI consulted- Dr JArdis Hughshas been managing him as outpt- as the patient's symptoms resolved, Dr JArdis Hughswas tapering Prednisone 20 mg BID which he had been on for 3-4 months and staring Mesalamine but symptoms recurred and therefore Prednisone was resumed last week at 20 mg daily for a 3 wk taper - currently on  IV steroids - cont Mesalamine and clear liquids - no BM since prior to being admitted - abdominal pain improved - vomiting last night  Active Problems:   HTN (hypertension) - Losartan on hold due to AKI on admission - Lopressor resumed with holding parameters    Leukocytosis - due to steroids?    Hypokalemia - replaced  Renal insufficiency  - improved to normal     DVT prophylaxis:  SCDs Code Status: full code Family Communication:   Disposition Plan: follow on med/surg   Consultants:   GI  Procedures:     Antimicrobials:  Anti-infectives    None       Subjective: Last BM yesterday AM, abdominal pain improved. Vomited liquids last night. No fever or chills.   Objective: Filed Vitals:   07/21/15 2330 07/22/15 0030 07/22/15 0129 07/22/15 0524  BP: 167/93 160/101 141/84 122/77  Pulse: 72 73 73 75  Temp:   98 F (36.7 C) 98 F (36.7 C)  TempSrc:   Oral Oral  Resp: 14  16 16   Height:      Weight:      SpO2: 100% 97% 97% 97%   No intake or output data in the 24 hours ending 07/22/15 1249 Filed Weights   07/21/15 2030  Weight: 96.163 kg (212 lb)    Examination: General exam: Appears comfortable  HEENT: PERRLA, oral mucosa moist, no sclera icterus or thrush Respiratory system: Clear to auscultation. Respiratory effort normal. Cardiovascular system: S1 & S2 heard, RRR.  No murmurs  Gastrointestinal system: Abdomen soft,  tender in LLQ, nondistended. Normal bowel sound. No organomegaly Central nervous system: Alert and oriented. No focal neurological deficits. Extremities: No cyanosis, clubbing or edema Skin: No rashes or ulcers Psychiatry:  Mood & affect appropriate.     Data Reviewed: I have personally reviewed following labs and imaging studies  CBC:  Recent Labs Lab 07/21/15 2047 07/22/15 0546  WBC 18.9* 12.5*  HGB 13.2 11.9*  HCT 38.6* 36.2*  MCV 85.6 86.8  PLT 483* 4291   Basic Metabolic Panel:  Recent Labs Lab 07/21/15 2047 07/22/15 0546  NA 141 142  K 3.4* 3.7  CL 105 107  CO2 26 24  GLUCOSE 113* 147*  BUN 10 9  CREATININE 1.32* 1.10  CALCIUM 9.0 8.2*   GFR: Estimated Creatinine Clearance: 97 mL/min (by C-G formula based on Cr of 1.1). Liver Function Tests:  Recent Labs Lab 07/21/15 2047  AST 17  ALT 20  ALKPHOS 65  BILITOT 0.9  PROT 7.8  ALBUMIN 3.9   No results for input(s): LIPASE, AMYLASE in the last 168 hours. No results for input(s): AMMONIA in the last 168 hours. Coagulation Profile: No results for input(s): INR, PROTIME in the last 168 hours. Cardiac Enzymes: No results  for input(s): CKTOTAL, CKMB, CKMBINDEX, TROPONINI in the last 168 hours. BNP (last 3 results) No results for input(s): PROBNP in the last 8760 hours. HbA1C: No results for input(s): HGBA1C in the last 72 hours. CBG: No results for input(s): GLUCAP in the last 168 hours. Lipid Profile: No results for input(s): CHOL, HDL, LDLCALC, TRIG, CHOLHDL, LDLDIRECT in the last 72 hours. Thyroid Function Tests: No results for input(s): TSH, T4TOTAL, FREET4, T3FREE, THYROIDAB in the last 72 hours. Anemia Panel: No results for input(s): VITAMINB12, FOLATE, FERRITIN, TIBC, IRON, RETICCTPCT in the last 72 hours. Urine analysis:    Component Value Date/Time   COLORURINE YELLOW 01/13/2011 [place] 01/13/2011 1025   LABSPEC 1.027 01/13/2011 1025   PHURINE 6.5 01/13/2011 1025   GLUCOSEU NEGATIVE 01/13/2011 1025   HGBUR NEGATIVE 01/13/2011 1025   [place] 01/13/2011 1025   [place] 01/13/2011 1025   PROTEINUR NEGATIVE 01/13/2011 1025   UROBILINOGEN 0.2 01/13/2011 1025   NITRITE NEGATIVE 01/13/2011 1025   LEUKOCYTESUR SMALL* 01/13/2011 1025   Sepsis Labs: @LABRCNTIP (procalcitonin:4,lacticidven:4)  )No results found for this or any previous visit (from the past 240 hour(s)).       Radiology Studies: No results found.      Scheduled Meds: . mesalamine  4.8 g Oral Q breakfast  . methylPREDNISolone (SOLU-MEDROL) injection  60 mg Intravenous Q6H  . metoprolol  25 mg Oral BID   Continuous Infusions: . sodium chloride 100 mL/hr at 07/22/15 0151     LOS: 0 days    Time spent in minutes: 24    [place], MD [facility] Pager: [email or web address] Password [email or web address] 07/22/2015, 12:49 PM
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
## 2015-07-22 NOTE — Care Management Note (Signed)
Case Management Note  Patient Details  Name: Savien Mamula MRN: [id number] Date of Birth: 1975-11-08  Subjective/Objective: 40 y/o m admitted w/Ulcerative colitis. From home.                   Action/Plan:d/c plan home.   Expected Discharge Date:                  Expected Discharge Plan:  Home/Self Care  In-House Referral:     Discharge planning Services  CM Consult  Post Acute Care Choice:    Choice offered to:     DME Arranged:    DME Agency:     HH Arranged:    HH Agency:     Status of Service:  In process, will continue to follow  Medicare Important Message Given:    Date Medicare IM Given:    Medicare IM give by:    Date Additional Medicare IM Given:    Additional Medicare Important Message give by:     If discussed at [place] of Stay Meetings, dates discussed:    Additional Comments:  Dessa Phi, RN 07/22/2015, 12:21 PM
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
## 2015-07-23 LAB — HEPATITIS B SURFACE ANTIBODY,QUALITATIVE: Hep B S Ab: NONREACTIVE

## 2015-07-23 LAB — CBC
HCT: 34.9 % — ABNORMAL LOW (ref 39.0–52.0)
Hemoglobin: 11.7 g/dL — ABNORMAL LOW (ref 13.0–17.0)
MCH: 28.5 pg (ref 26.0–34.0)
MCHC: 33.5 g/dL (ref 30.0–36.0)
MCV: 85.1 fL (ref 78.0–100.0)
Platelets: 446 10*3/uL — ABNORMAL HIGH (ref 150–400)
RBC: 4.1 MIL/uL — ABNORMAL LOW (ref 4.22–5.81)
RDW: 14.9 % (ref 11.5–15.5)
WBC: 15.9 10*3/uL — ABNORMAL HIGH (ref 4.0–10.5)

## 2015-07-23 LAB — BASIC METABOLIC PANEL
Anion gap: 11 (ref 5–15)
BUN: 12 mg/dL (ref 6–20)
CO2: 24 mmol/L (ref 22–32)
Calcium: 8.4 mg/dL — ABNORMAL LOW (ref 8.9–10.3)
Chloride: 110 mmol/L (ref 101–111)
Creatinine, Ser: 1.13 mg/dL (ref 0.61–1.24)
GFR calc Af Amer: >60 mL/min (ref >60)
GFR calc non Af Amer: >60 mL/min (ref >60)
Glucose, Bld: 127 mg/dL — ABNORMAL HIGH (ref 65–99)
Potassium: 3.4 mmol/L — ABNORMAL LOW (ref 3.5–5.1)
Sodium: 145 mmol/L (ref 135–145)

## 2015-07-23 LAB — HEPATITIS B SURFACE ANTIGEN: Hepatitis B Surface Ag: NEGATIVE

## 2015-07-23 LAB — HEPATITIS C ANTIBODY: HCV Ab: <0.1 s/co ratio (ref 0.0–0.9)

## 2015-07-23 MED ORDER — MESALAMINE 1000 MG RE SUPP
1000.0000 mg | Freq: Every day | RECTAL | Status: DC
  Administered 2015-07-23 – 2015-07-24 (×2): 1000 mg via RECTAL
  Filled 2015-07-23 (×2): qty 1

## 2015-07-23 MED ORDER — POTASSIUM CHLORIDE CRYS ER 20 MEQ PO TBCR
40.0000 meq | EXTENDED_RELEASE_TABLET | ORAL | Status: AC
  Administered 2015-07-23 (×2): 40 meq via ORAL
  Filled 2015-07-23 (×2): qty 2

## 2015-07-23 MED ORDER — MESALAMINE 4 G RE ENEM
4.0000 g | ENEMA | Freq: Every day | RECTAL | Status: DC
  Administered 2015-07-23: 4 g via RECTAL
  Filled 2015-07-23 (×2): qty 60

## 2015-07-23 NOTE — Progress Notes (Signed)
     [facility] Progress Note  Subjective:  Says that he had 6-7 BM's from 10 pm to 10 am; a couple were large and others just small amounts.  Some blood.  Ate breakfast and seemed to "go right through me".  Objective:  Vital signs in last 24 hours: Temp:  [98.2 F (36.8 C)-98.6 F (37 C)] 98.6 F (37 C) (05/04 0553) Pulse Rate:  [76-79] 79 (05/04 0553) Resp:  [14-16] 16 (05/04 0553) BP: (140-155)/(77-96) 155/96 mmHg (05/04 0553) SpO2:  [99 %] 99 % (05/04 0553) Last BM Date: 07/23/15 General:  Alert, Well-developed,  in NAD Heart:  Regular rate and rhythm; no murmurs Pulm:  CTAB.  No W/R/R. Abdomen:  Soft, non-distended.  BS present, slightly hyperactive.  Mild LLQ TTP.  Extremities:  Without edema. Neurologic:  Alert and oriented x 4; grossly normal neurologically. Psych:  Alert and cooperative. Normal mood and affect.  Lab Results:  Recent Labs  07/21/15 2047 07/22/15 0546 07/23/15 0516  WBC 18.9* 12.5* 15.9*  HGB 13.2 11.9* 11.7*  HCT 38.6* 36.2* 34.9*  PLT 483* 449* 446*   BMET  Recent Labs  07/21/15 2047 07/22/15 0546 07/23/15 0516  NA 141 142 145  K 3.4* 3.7 3.4*  CL 105 107 110  CO2 26 24 24   GLUCOSE 113* 147* 127*  BUN 10 9 12   CREATININE 1.32* 1.10 1.13  CALCIUM 9.0 8.2* 8.4*   LFT  Recent Labs  07/21/15 2047  PROT 7.8  ALBUMIN 3.9  AST 17  ALT 20  ALKPHOS 65  BILITOT 0.9   Hepatitis Panel  Recent Labs  07/22/15 1451  HEPBSAG Negative  HCVAB <0.1   Assessment / Plan: -40 year old male with ulcerative colitis diagnosed 02/2015: Only started on mesalamine 4 weeks ago, but symptoms returned almost as soon as prednisone discontinued/tapered.  Received large doses of solumedrol here then changed back to PO prednisone 40 mg daily today.  Cdiff negative.  Hepatitis B and C negative.  Quantiferon gold pending.  Will probably need escalation of therapy -Leukocytosis: Probably due to steroid use, inflammatory response,  etc.  *? If we should back off to liquids again given increase in diarrhea.  Will discuss with Dr. Silverio Decamp. *Has appt with Dr. Ardis Hughs on 6/5 at 9:45 am.   LOS: 1 day   ZEHR, JESSICA D.  07/23/2015, 12:35 PM  Pager number [phone]    Attending physician's note   I have taken an interval history, reviewed the chart and examined the patient. I agree with the Advanced Practitioner's note, impression and recommendations. He is doing better on prednisone. Continue Rowasa enemas at bed time x 10-14 days and Lialda 4.8 g daily. Okay to discharge on prednisone taper, will arrange for follow-up in office and we'll have to consider either starting biologic or 6-MP to maintain remission.   Damaris Hippo, MD [phone] Mon-Fri 8a-5p [phone] after 5p, weekends, holidays

## 2015-07-23 NOTE — Progress Notes (Addendum)
PROGRESS NOTE    Corey Costa  ZYY:[id number] DOB: [birth year] DOA: 07/21/2015  PCP: [name], MD  Outpatient Specialists:  Dr Ardis Hughs, GI    Brief Narrative:  Corey Costa is a 40 y.o. gentleman with a history of HTN, asthma, and ulcerative colitis, just diagnosed in January 2017, who presents for evaluation of intractable left lower quadrant abdominal pain. He has had bloody diarrhea, palpitations, light-headedness, nausea and vomiting. He reports that he had actually tapered off of oral steroids approximately two weeks ago, but he has been back on prednisone 18m daily for the past week due to recurrent symptoms.   Assessment & Plan:   Principal Problem:   Ulcerative colitis with rectal bleeding C- olonoscopy Dr. JArdis Hughs12 2016 found normal terminal ileum, normal appearing right colon, moderate to severe inflammation from the anus to about the hepatic flexure. Biopsies of terminal ileum were normal, biopsies of right colon showed chronic colitis, biopsies of left colon showed chronic active colitis. - GI consulted- Dr JArdis Hughshas been managing him as outpt- as the patient's symptoms resolved, Dr JArdis Hughswas tapering Prednisone 20 mg BID which he had been on for 3-4 months and staring Mesalamine but symptoms recurred and therefore Prednisone was resumed last week at 20 mg daily for a 3 wk taper - currently on  IV steroids - cont Mesalamine and clear liquids - had 3 stools this AM- 2 large ones and one small one with a small amount of blood-  - c diff negative - abdominal pain improved - vomiting resolved  Active Problems:   HTN (hypertension) - Losartan on hold due to renal insufficiency on admission- can resume in AM - Lopressor resumed with holding parameters    Leukocytosis - due to steroids - acute stress    Hypokalemia - replaced  Renal insufficiency  - improved to normal     DVT prophylaxis:  SCDs Code Status: full code Family Communication:   Disposition Plan:  follow on med/surg   Consultants:   GI  Procedures:     Antimicrobials:  Anti-infectives    None      Subjective:  had a large breakfast- had 3 stools this AM- 2 large ones and one small one with a small amount of blood-no vomiting.   Objective: Filed Vitals:   07/22/15 0129 07/22/15 0524 07/22/15 1400 07/23/15 0553  BP: 141/84 122/77 140/77 155/96  Pulse: 73 75 76 79  Temp: 98 F (36.7 C) 98 F (36.7 C) 98.2 F (36.8 C) 98.6 F (37 C)  TempSrc: Oral Oral Oral Oral  Resp: 16 16 14 16   Height:      Weight:      SpO2: 97% 97% 99% 99%   No intake or output data in the 24 hours ending 07/23/15 1048 Filed Weights   07/21/15 2030  Weight: 96.163 kg (212 lb)    Examination: General exam: Appears comfortable  HEENT: PERRLA, oral mucosa moist, no sclera icterus or thrush Respiratory system: Clear to auscultation. Respiratory effort normal. Cardiovascular system: S1 & S2 heard, RRR.  No murmurs  Gastrointestinal system: Abdomen soft,  tender in LUQ and LLQ, nondistended. Normal bowel sound. No organomegaly Central nervous system: Alert and oriented. No focal neurological deficits. Extremities: No cyanosis, clubbing or edema Skin: No rashes or ulcers Psychiatry:  Mood & affect appropriate.     Data Reviewed: I have personally reviewed following labs and imaging studies  CBC:  Recent Labs Lab 07/21/15 2047 07/22/15 0546 07/23/15 0516  WBC 18.9*  12.5* 15.9*  HGB 13.2 11.9* 11.7*  HCT 38.6* 36.2* 34.9*  MCV 85.6 86.8 85.1  PLT 483* 449* 211*   Basic Metabolic Panel:  Recent Labs Lab 07/21/15 2047 07/22/15 0546 07/23/15 0516  NA 141 142 145  K 3.4* 3.7 3.4*  CL 105 107 110  CO2 26 24 24   GLUCOSE 113* 147* 127*  BUN 10 9 12   CREATININE 1.32* 1.10 1.13  CALCIUM 9.0 8.2* 8.4*   GFR: Estimated Creatinine Clearance: 94.4 mL/min (by C-G formula based on Cr of 1.13). Liver Function Tests:  Recent Labs Lab 07/21/15 2047  AST 17  ALT 20  ALKPHOS  65  BILITOT 0.9  PROT 7.8  ALBUMIN 3.9   No results for input(s): LIPASE, AMYLASE in the last 168 hours. No results for input(s): AMMONIA in the last 168 hours. Coagulation Profile: No results for input(s): INR, PROTIME in the last 168 hours. Cardiac Enzymes: No results for input(s): CKTOTAL, CKMB, CKMBINDEX, TROPONINI in the last 168 hours. BNP (last 3 results) No results for input(s): PROBNP in the last 8760 hours. HbA1C: No results for input(s): HGBA1C in the last 72 hours. CBG: No results for input(s): GLUCAP in the last 168 hours. Lipid Profile: No results for input(s): CHOL, HDL, LDLCALC, TRIG, CHOLHDL, LDLDIRECT in the last 72 hours. Thyroid Function Tests: No results for input(s): TSH, T4TOTAL, FREET4, T3FREE, THYROIDAB in the last 72 hours. Anemia Panel: No results for input(s): VITAMINB12, FOLATE, FERRITIN, TIBC, IRON, RETICCTPCT in the last 72 hours. Urine analysis:    Component Value Date/Time   COLORURINE YELLOW 01/13/2011 [place] 01/13/2011 1025   LABSPEC 1.027 01/13/2011 1025   PHURINE 6.5 01/13/2011 1025   GLUCOSEU NEGATIVE 01/13/2011 1025   HGBUR NEGATIVE 01/13/2011 1025   [place] 01/13/2011 1025   [place] 01/13/2011 1025   PROTEINUR NEGATIVE 01/13/2011 1025   UROBILINOGEN 0.2 01/13/2011 1025   NITRITE NEGATIVE 01/13/2011 1025   LEUKOCYTESUR SMALL* 01/13/2011 1025   Sepsis Labs: @LABRCNTIP (procalcitonin:4,lacticidven:4)  ) Recent Results (from the past 240 hour(s))  C difficile quick scan w PCR reflex     Status: None   Collection Time: 07/22/15  3:08 PM  Result Value Ref Range Status   C Diff antigen NEGATIVE NEGATIVE Final   C Diff toxin NEGATIVE NEGATIVE Final   C Diff interpretation Negative for toxigenic C. difficile  Final         Radiology Studies: No results found.      Scheduled Meds: . mesalamine  4.8 g Oral Q breakfast  . metoprolol  25 mg Oral BID  . potassium chloride  40 mEq Oral  Q4H  . predniSONE  40 mg Oral QAC breakfast   Continuous Infusions: . sodium chloride 100 mL/hr at 07/22/15 0151     LOS: 1 day    Time spent in minutes: 59    Chaffee, MD [facility] Pager: [email or web address] Password [email or web address] 07/23/2015, 10:48 AM

## 2015-07-24 LAB — BASIC METABOLIC PANEL
Anion gap: 7 (ref 5–15)
BUN: 9 mg/dL (ref 6–20)
CO2: 25 mmol/L (ref 22–32)
Calcium: 8.1 mg/dL — ABNORMAL LOW (ref 8.9–10.3)
Chloride: 110 mmol/L (ref 101–111)
Creatinine, Ser: 1.09 mg/dL (ref 0.61–1.24)
GFR calc Af Amer: >60 mL/min (ref >60)
GFR calc non Af Amer: >60 mL/min (ref >60)
Glucose, Bld: 104 mg/dL — ABNORMAL HIGH (ref 65–99)
Potassium: 3.1 mmol/L — ABNORMAL LOW (ref 3.5–5.1)
Sodium: 142 mmol/L (ref 135–145)

## 2015-07-24 MED ORDER — POTASSIUM CHLORIDE CRYS ER 20 MEQ PO TBCR
40.0000 meq | EXTENDED_RELEASE_TABLET | ORAL | Status: AC
  Administered 2015-07-24 (×2): 40 meq via ORAL
  Filled 2015-07-24 (×2): qty 2

## 2015-07-24 MED ORDER — PREDNISONE 20 MG PO TABS: 40.0000 mg | ORAL_TABLET | Freq: Every day | ORAL | Status: DC

## 2015-07-24 NOTE — Progress Notes (Signed)
Patient given discharge instructions, and verbalized an understanding of all discharge instructions.  Patient agrees with discharge plan, and is being discharged in stable medical condition.  Patient given transportation via wheelchair. 

## 2015-07-24 NOTE — Discharge Summary (Signed)
Physician Discharge Summary  Corey Costa EHU:[id number] DOB: [birth year] DOA: 07/21/2015  PCP: [name], MD  Admit date: 07/21/2015 Discharge date: 07/24/2015  Time spent: 50 minutes  Recommendations for Outpatient Follow-up:  1. GI will arrange f/u and call him  Discharge Condition: stable    Discharge Diagnoses:  Principal Problem:   Ulcerative colitis with rectal bleeding ([place]) Active Problems:   Leukocytosis   AKI (acute kidney injury) ([place])   HTN (hypertension)   Hypokalemia   History of present illness:  Corey Costa is a 40 y.o. gentleman with a history of HTN, asthma, and ulcerative colitis, just diagnosed in January 2017, who presents for evaluation of intractable left lower quadrant abdominal pain. He has had bloody diarrhea, palpitations, light-headedness, nausea and vomiting. He reports that he had actually tapered off of oral steroids approximately two weeks ago, but he has been back on prednisone 78m daily for the past week due to recurrent symptoms.   Hospital Course:  Ulcerative colitis with rectal bleeding C- olonoscopy Dr. JArdis Hughs12 2016 found normal terminal ileum, normal appearing right colon, moderate to severe inflammation from the anus to about the hepatic flexure. Biopsies of terminal ileum were normal, biopsies of right colon showed chronic colitis, biopsies of left colon showed chronic active colitis. - GI consulted- Dr JArdis Hughshas been managing him as outpt- as the patient's symptoms resolved, Dr JArdis Hughswas tapering Prednisone 20 mg BID which he had been on for 3-4 months and staring Mesalamine but symptoms recurred and therefore Prednisone was resumed last week at 20 mg daily for a 3 wk taper - currently on IV steroids - cont Mesalamine and clear liquids - c diff negative - abdominal pain, vomiting, bleeding and diarrhea improved - GI has increased Prednisone to 40 mg daily and will call him with a f/u appt  Active Problems:  HTN (hypertension) -  Losartan was on hold due to renal insufficiency on admission- can resume today - Lopressor resumed with holding parameters   Leukocytosis - due to steroids - acute stress   Hypokalemia - replaced  Renal insufficiency  - improved to normal   Procedures:  none  Consultations:  GI  Discharge Exam: Filed Weights   07/21/15 2030  Weight: 96.163 kg (212 lb)   Filed Vitals:   07/23/15 2142 07/24/15 0648  BP: 152/87 134/87  Pulse: 65 75  Temp: 99.1 F (37.3 C) 98.2 F (36.8 C)  Resp: 16 15    General: AAO x 3, no distress Cardiovascular: RRR, no murmurs  Respiratory: clear to auscultation bilaterally GI: soft, non-tender, non-distended, bowel sound positive  Discharge Instructions You were cared for by a hospitalist during your hospital stay. If you have any questions about your discharge medications or the care you received while you were in the hospital after you are discharged, you can call the unit and asked to speak with the hospitalist on call if the hospitalist that took care of you is not available. Once you are discharged, your primary care physician will handle any further medical issues. Please note that NO REFILLS for any discharge medications will be authorized once you are discharged, as it is imperative that you return to your primary care physician (or establish a relationship with a primary care physician if you do not have one) for your aftercare needs so that they can reassess your need for medications and monitor your lab values.      Discharge Instructions    Diet - low sodium heart healthy  Complete by:  As directed      Increase activity slowly    Complete by:  As directed             Medication List    TAKE these medications        betamethasone dipropionate 0.05 % ointment  Commonly known as:  DIPROLENE  Apply topically 2 (two) times daily.     losartan 50 MG tablet  Commonly known as:  COZAAR  Take 1 tablet (50 mg total) by mouth  daily.     mesalamine 1.2 g EC tablet  Commonly known as:  LIALDA  Take 4 tablets (4.8 g total) by mouth daily with breakfast.     metoprolol 50 MG tablet  Commonly known as:  LOPRESSOR  Take 2 tablets in the morning and 1 tablet in the evening.     predniSONE 20 MG tablet  Commonly known as:  DELTASONE  Take 2 tablets (40 mg total) by mouth daily before breakfast.     SUMAtriptan 50 MG tablet  Commonly known as:  IMITREX  Take 1 tablet with the onset of headache, repeat in 2 hours if that headache continues. Do not take more than 2 tablets in a 24 hour period.       No Known Allergies Follow-up Information    Follow up with Milus Banister, MD In 1 week.   Specialty:  Gastroenterology   Why:  if their office does not call you by Tues, please call them and let them know you need a hospital follow up   Contact information:   [address] [place] [address] [phone]        The results of significant diagnostics from this hospitalization (including imaging, microbiology, ancillary and laboratory) are listed below for reference.    Significant Diagnostic Studies: No results found.  Microbiology: Recent Results (from the past 240 hour(s))  C difficile quick scan w PCR reflex     Status: None   Collection Time: 07/22/15  3:08 PM  Result Value Ref Range Status   C Diff antigen NEGATIVE NEGATIVE Final   C Diff toxin NEGATIVE NEGATIVE Final   C Diff interpretation Negative for toxigenic C. difficile  Final     Labs: Basic Metabolic Panel:  Recent Labs Lab 07/21/15 2047 07/22/15 0546 07/23/15 0516 07/24/15 0758  NA 141 142 145 142  K 3.4* 3.7 3.4* 3.1*  CL 105 107 110 110  CO2 26 24 24 25   GLUCOSE 113* 147* 127* 104*  BUN 10 9 12 9   CREATININE 1.32* 1.10 1.13 1.09  CALCIUM 9.0 8.2* 8.4* 8.1*   Liver Function Tests:  Recent Labs Lab 07/21/15 2047  AST 17  ALT 20  ALKPHOS 65  BILITOT 0.9  PROT 7.8  ALBUMIN 3.9   No results for input(s):  LIPASE, AMYLASE in the last 168 hours. No results for input(s): AMMONIA in the last 168 hours. CBC:  Recent Labs Lab 07/21/15 2047 07/22/15 0546 07/23/15 0516  WBC 18.9* 12.5* 15.9*  HGB 13.2 11.9* 11.7*  HCT 38.6* 36.2* 34.9*  MCV 85.6 86.8 85.1  PLT 483* 449* 446*   Cardiac Enzymes: No results for input(s): CKTOTAL, CKMB, CKMBINDEX, TROPONINI in the last 168 hours. BNP: BNP (last 3 results) No results for input(s): BNP in the last 8760 hours.  ProBNP (last 3 results) No results for input(s): PROBNP in the last 8760 hours.  CBG: No results for input(s): GLUCAP in the last 168 hours.  SignedDebbe Odea, MD [facility] 07/24/2015, 10:39 AM

## 2015-07-24 NOTE — Progress Notes (Signed)
     [facility] Progress Note  Subjective:  Feeling better today.  Did well with dinner last night but has not had breakfast yet today.  Only one BM in the past 12 hours with no blood.  Objective:  Vital signs in last 24 hours: Temp:  [98.2 F (36.8 C)-99.1 F (37.3 C)] 98.2 F (36.8 C) (05/05 0648) Pulse Rate:  [65-75] 75 (05/05 0648) Resp:  [15-16] 15 (05/05 0648) BP: (118-152)/(66-87) 134/87 mmHg (05/05 0648) SpO2:  [99 %-100 %] 100 % (05/05 0648) Last BM Date: 07/24/15 General:  Alert, Well-developed, in NAD Heart:  Regular rate and rhythm; no murmurs Pulm:  CTAB.  No W/R/R. Abdomen:  Soft, non-distended.  BS present.  Non-tender. Extremities:  Without edema. Neurologic:  Alert and oriented x 4;  grossly normal neurologically. Psych:  Alert and cooperative. Normal mood and affect.  Intake/Output from previous day: 05/04 0701 - 05/05 0700 In: 480 [P.O.:480] Out: -   Lab Results:  Recent Labs  07/21/15 2047 07/22/15 0546 07/23/15 0516  WBC 18.9* 12.5* 15.9*  HGB 13.2 11.9* 11.7*  HCT 38.6* 36.2* 34.9*  PLT 483* 449* 446*   BMET  Recent Labs  07/22/15 0546 07/23/15 0516 07/24/15 0758  NA 142 145 142  K 3.7 3.4* 3.1*  CL 107 110 110  CO2 24 24 25   GLUCOSE 147* 127* 104*  BUN 9 12 9   CREATININE 1.10 1.13 1.09  CALCIUM 8.2* 8.4* 8.1*   LFT  Recent Labs  07/21/15 2047  PROT 7.8  ALBUMIN 3.9  AST 17  ALT 20  ALKPHOS 65  BILITOT 0.9   Hepatitis Panel  Recent Labs  07/22/15 1451  HEPBSAG Negative  HCVAB <0.1   Assessment / Plan: -40 year old male with ulcerative colitis diagnosed 02/2015: Only started on mesalamine 4 weeks ago, but symptoms returned almost as soon as prednisone discontinued. Received large doses of solumedrol here then changed back to PO prednisone 40 mg daily today. Cdiff negative. Hepatitis B and C negative. Quantiferon gold pending. Will probably need escalation of therapy, and that decision can be made  by Dr. Ardis Hughs at his follow-up appt listed below.  In the interim he needs to continue prednisone 40 mg daily at discharge and Canasa suppositories at bedtime (does not need to go home with Rowasa enemas).  Needs to follow-up a low fiber/low residue and lactose free diet. -Leukocytosis: Probably due to steroid use, inflammatory response, etc.  *Has appt with Dr. Ardis Hughs on 6/5 at 9:45 am.  Can likely go home later today if does ok with breakfast.   LOS: 2 days   ZEHR, JESSICA D.  07/24/2015, 9:11 AM  Pager number [phone]    Attending physician's note   I have taken an interval history, reviewed the chart and examined the patient. I agree with the Advanced Practitioner's note, impression and recommendations.   Damaris Hippo, MD [phone] Mon-Fri 8a-5p [phone] after 5p, weekends, holidays

## 2015-07-27 LAB — QUANTIFERON IN TUBE
QFT TB AG MINUS NIL VALUE: 0 IU/mL
QUANTIFERON MITOGEN VALUE: 0.03 IU/mL
QUANTIFERON TB AG VALUE: 0.02 IU/mL
QUANTIFERON TB GOLD: UNDETERMINED
Quantiferon Nil Value: 0.02 IU/mL

## 2015-07-27 LAB — QUANTIFERON TB GOLD ASSAY (BLOOD)

## 2015-07-29 ENCOUNTER — Telehealth: Payer: Self-pay | Admitting: Gastroenterology

## 2015-07-29 NOTE — Telephone Encounter (Signed)
The pt was notified that Dr Silverio Decamp made a note of the 08/24/15 appt in her discharge summary and he should keep that appt or call unless he develops problems prior to then.  The pt agreed and verbalized understanding

## 2015-08-04 ENCOUNTER — Telehealth: Payer: Self-pay

## 2015-08-04 DIAGNOSIS — K51919 Ulcerative colitis, unspecified with unspecified complications: Secondary | ICD-10-CM

## 2015-08-04 NOTE — Telephone Encounter (Signed)
-----   Message from Milus Banister, MD sent at 08/04/2015  7:06 AM EDT ----- Regarding: RE: TB gold Jose, Thanks.  Yes it was ordered in case of need for anti-TNF meds.  Not sure we're going in that direction just yet.  I will handle the results, thanks  Saryn Cherry, Can you contact Ja-Kwan. I'd like him to get TPMT enzyme activity level?  This week sometime.  I may choose he try azathiaprine before remicade or humira.  Thanks  dj   ----- Message -----    From: Colon Branch, MD    Sent: 07/30/2015   9:41 AM      To: Milus Banister, MD, Colon Branch, MD Subject: TB gold                                        Linna Hoff, The patient has a indeterminant TB gold, I assume this was ordered as he may need anti -TNF drugs, I am not sure how to manage indeterminate TB gold, would you like me to send him to infectious diseases?  thx JP

## 2015-08-04 NOTE — Telephone Encounter (Signed)
The pt was notified and will be in on Wed, or Thurs for labs .

## 2015-08-04 NOTE — Telephone Encounter (Signed)
Left message on machine to call back Lab has been added to [facility]

## 2015-08-24 ENCOUNTER — Encounter: Payer: Self-pay | Admitting: Gastroenterology

## 2015-08-24 ENCOUNTER — Other Ambulatory Visit: Payer: BLUE CROSS/BLUE SHIELD

## 2015-08-24 ENCOUNTER — Ambulatory Visit (INDEPENDENT_AMBULATORY_CARE_PROVIDER_SITE_OTHER): Payer: BLUE CROSS/BLUE SHIELD | Admitting: Gastroenterology

## 2015-08-24 VITALS — BP 144/86 | HR 80 | Ht 66.5 in | Wt 219.1 lb

## 2015-08-24 DIAGNOSIS — K5100 Ulcerative (chronic) pancolitis without complications: Secondary | ICD-10-CM
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
## 2015-08-24 NOTE — Patient Instructions (Addendum)
You will have labs checked today in the basement lab.  Please head down after you check out with the front desk  (TPMT enzyme activity level) Pending the blood tests you will likely be started on azathiaprine 69m/kg dosing (208mper day). Start tapering prednisone by 85m58mer week. Please return to see Dr. JacArdis Hughs 6-8 weeks.

## 2015-08-29 LAB — THIOPURINE METHYLTRANSFERASE (TPMT), RBC: Thiopurine Methyltransferase, RBC: 15 nmol/hr/mL RBC

## 2015-08-31 ENCOUNTER — Other Ambulatory Visit: Payer: Self-pay | Admitting: Internal Medicine

## 2015-08-31 ENCOUNTER — Encounter: Payer: Self-pay | Admitting: Gastroenterology

## 2015-09-01 ENCOUNTER — Other Ambulatory Visit: Payer: Self-pay

## 2015-09-01 DIAGNOSIS — K5100 Ulcerative (chronic) pancolitis without complications: Secondary | ICD-10-CM

## 2015-09-01 MED ORDER — AZATHIOPRINE 100 MG PO TABS: 200.0000 mg | ORAL_TABLET | Freq: Every day | ORAL | Status: DC

## 2015-09-02 ENCOUNTER — Telehealth: Payer: Self-pay | Admitting: Gastroenterology

## 2015-09-02 MED ORDER — MERCAPTOPURINE 50 MG PO TABS
100.0000 mg | ORAL_TABLET | Freq: Every day | ORAL | Status: DC
Start: 2015-09-02 — End: 2016-07-15

## 2015-09-02 NOTE — Telephone Encounter (Signed)
Prescription has been sent to the pharmacy and the pt has been notified to call if it is still too expensive.

## 2015-09-02 NOTE — Telephone Encounter (Signed)
Can you check on 17m pricing for him?  Start dose 1026monce daily.

## 2015-09-02 NOTE — Telephone Encounter (Signed)
Pt states that the azathioprine is too expensive I have researched and he does not qualify for patient assistance.  Please advise

## 2015-09-06 ENCOUNTER — Other Ambulatory Visit: Payer: Self-pay | Admitting: Gastroenterology

## 2015-09-28 ENCOUNTER — Telehealth: Payer: Self-pay | Admitting: Gastroenterology

## 2015-09-28 NOTE — Telephone Encounter (Signed)
Pt's wife called back and does not need a call back.  She was able to get his medication worked out at [facility]

## 2015-10-09 ENCOUNTER — Other Ambulatory Visit: Payer: Self-pay

## 2015-10-09 ENCOUNTER — Telehealth: Payer: Self-pay | Admitting: Gastroenterology

## 2015-10-09 MED ORDER — AZATHIOPRINE 50 MG PO TABS: 200.0000 mg | ORAL_TABLET | Freq: Every day | ORAL | Status: DC

## 2015-10-09 NOTE — Telephone Encounter (Signed)
Called back but hung up before I could transfer him to you.

## 2015-10-09 NOTE — Telephone Encounter (Signed)
Left message on machine to call back  

## 2015-10-09 NOTE — Telephone Encounter (Signed)
Pt was notified to have the pharmacy send a request for the cheaper Imuran alternative and I will forward to Dr Ardis Hughs for review

## 2015-10-13 MED FILL — MERCAPTOPURINE 50 MG TABLET: 50 | 30 days supply | Qty: 60 | Fill #0

## 2015-10-28 ENCOUNTER — Ambulatory Visit: Payer: BLUE CROSS/BLUE SHIELD | Admitting: Gastroenterology

## 2015-11-16 MED FILL — MERCAPTOPURINE 50 MG TABLET: 50 | 30 days supply | Qty: 60 | Fill #1 | Status: TO

## 2015-11-20 ENCOUNTER — Encounter: Payer: Self-pay | Admitting: Internal Medicine

## 2015-11-20 ENCOUNTER — Ambulatory Visit (INDEPENDENT_AMBULATORY_CARE_PROVIDER_SITE_OTHER): Payer: BLUE CROSS/BLUE SHIELD | Admitting: Internal Medicine

## 2015-11-20 VITALS — BP 122/80 | HR 70 | Temp 98.4°F | Resp 14 | Ht 67.0 in | Wt 218.5 lb

## 2015-11-20 DIAGNOSIS — Z0000 Encounter for general adult medical examination without abnormal findings: Secondary | ICD-10-CM | POA: Diagnosis not present

## 2015-11-20 DIAGNOSIS — Z23 Encounter for immunization: Secondary | ICD-10-CM

## 2015-11-20 LAB — CBC WITH DIFFERENTIAL/PLATELET
Basophils Absolute: 0 10*3/uL (ref 0.0–0.1)
Basophils Relative: 0.4 % (ref 0.0–3.0)
Eosinophils Absolute: 1.1 10*3/uL — ABNORMAL HIGH (ref 0.0–0.7)
Eosinophils Relative: 13 % — ABNORMAL HIGH (ref 0.0–5.0)
HCT: 41.3 % (ref 39.0–52.0)
Hemoglobin: 13.7 g/dL (ref 13.0–17.0)
Lymphocytes Relative: 24.4 % (ref 12.0–46.0)
Lymphs Abs: 2.1 10*3/uL (ref 0.7–4.0)
MCHC: 33.2 g/dL (ref 30.0–36.0)
MCV: 91.1 fl (ref 78.0–100.0)
Monocytes Absolute: 1.2 10*3/uL — ABNORMAL HIGH (ref 0.1–1.0)
Monocytes Relative: 13.5 % — ABNORMAL HIGH (ref 3.0–12.0)
Neutro Abs: 4.3 10*3/uL (ref 1.4–7.7)
Neutrophils Relative %: 48.7 % (ref 43.0–77.0)
Platelets: 389 10*3/uL (ref 150.0–400.0)
RBC: 4.54 Mil/uL (ref 4.22–5.81)
RDW: 16.6 % — ABNORMAL HIGH (ref 11.5–15.5)
WBC: 8.8 10*3/uL (ref 4.0–10.5)

## 2015-11-20 LAB — COMPREHENSIVE METABOLIC PANEL
ALT: 19 U/L (ref 0–53)
AST: 14 U/L (ref 0–37)
Albumin: 4.2 g/dL (ref 3.5–5.2)
Alkaline Phosphatase: 73 U/L (ref 39–117)
BUN: 11 mg/dL (ref 6–23)
CO2: 29 mEq/L (ref 19–32)
Calcium: 8.8 mg/dL (ref 8.4–10.5)
Chloride: 107 mEq/L (ref 96–112)
Creatinine, Ser: 1.11 mg/dL (ref 0.40–1.50)
GFR: 94.01 mL/min (ref >60.00)
Glucose, Bld: 103 mg/dL — ABNORMAL HIGH (ref 70–99)
Potassium: 3.9 mEq/L (ref 3.5–5.1)
Sodium: 141 mEq/L (ref 135–145)
Total Bilirubin: 0.6 mg/dL (ref 0.2–1.2)
Total Protein: 7.5 g/dL (ref 6.0–8.3)

## 2015-11-20 LAB — LIPID PANEL
Cholesterol: 120 mg/dL (ref 0–200)
HDL: 37.3 mg/dL — ABNORMAL LOW (ref >39.00)
LDL Cholesterol: 76 mg/dL (ref 0–99)
NonHDL: 82.78
Total CHOL/HDL Ratio: 3
Triglycerides: 36 mg/dL (ref 0.0–149.0)
VLDL: 7.2 mg/dL (ref 0.0–40.0)

## 2015-11-20 LAB — HEMOGLOBIN A1C: Hgb A1c MFr Bld: 6.4 % (ref 4.6–6.5)

## 2015-11-20 MED ORDER — LOSARTAN POTASSIUM 50 MG PO TABS: 50.0000 mg | ORAL_TABLET | Freq: Every day | ORAL | 3 refills | Status: DC

## 2015-11-20 MED ORDER — METOPROLOL TARTRATE 50 MG PO TABS: ORAL_TABLET | ORAL | 3 refills | Status: DC

## 2015-11-20 NOTE — Patient Instructions (Signed)
GO TO THE LAB : Get the blood work     GO TO THE FRONT DESK Schedule your next appointment for a  complete physical exam in one year, fasting     Check the  blood pressure 2  times a month  Be sure your blood pressure is between 110/65 and  145/85. If it is consistently higher or lower, let me know

## 2015-11-20 NOTE — Assessment & Plan Note (Addendum)
Td 2010;  hesitant about immunizations, had 2 flu shots before, no allergic reaction but had "the flu" after one of the flu shots. We eventually agreed to try the flu shot this year. We'll discuss a pneumonia shot at the next opportunity.   Labs labs: CMP, FLP, CBC, A1c.  Diet and exercise: Discussed.

## 2015-11-20 NOTE — Progress Notes (Signed)
Pre visit review using our clinic review tool, if applicable. No additional management support is needed unless otherwise documented below in the visit note. 
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
## 2015-11-22 NOTE — Assessment & Plan Note (Signed)
HTN: Controlled, RF PRN. Ulcerative colitis: Controlled, follow-up by GI. RTC one year.

## 2015-12-11 ENCOUNTER — Telehealth: Payer: Self-pay | Admitting: Gastroenterology

## 2015-12-14 ENCOUNTER — Other Ambulatory Visit: Payer: Self-pay

## 2015-12-14 DIAGNOSIS — R197 Diarrhea, unspecified: Secondary | ICD-10-CM

## 2015-12-14 NOTE — Telephone Encounter (Signed)
I have ordered both. The patient was coming today

## 2015-12-14 NOTE — Telephone Encounter (Signed)
C Diff PCR is fine. If negative and he is off all steroids, I would recommend a trial of Uceris if he can get it approved by insurance as outlined below. If not approved or if he is already on low dose prednisone, would increase back to 41m / day if C diff negative.

## 2015-12-14 NOTE — Telephone Encounter (Signed)
Chart reviewed. Is he still taking prednisone at all? I see he was previously tapering off a low dose, and is on Lialda and 6MP. If he is having a flare of symptoms recommend stool study for C Diff to ensure negative if you can order this. Otherwise if he is off prednisone altogether, would recommend starting Uceris at 2m daily x 6 weeks to see if this can help put him into remission. If he continues to be at low dose prednisone, please let me know and I will advise increasing his dose of this.   Long term he may need biologic therapy, defer this decision to Dr. JArdis Hughswho is his primary, will cc him on this string.

## 2015-12-14 NOTE — Telephone Encounter (Signed)
Doc of the Day Spoke with the patient. He is having 4 to 5 bloody stools each day. Sometimes he has abdominal pain, but not constant. Feels a little nauseated at times. No vomiting or fevers. Please advise.

## 2015-12-14 NOTE — Telephone Encounter (Signed)
Patient is on Lialda and 6MP. He will come to the lab today for C diff by PCR. Do you want the toxin screen also?

## 2015-12-15 ENCOUNTER — Other Ambulatory Visit: Payer: Self-pay

## 2015-12-15 NOTE — Telephone Encounter (Signed)
Patient is not on prednisone.

## 2015-12-15 NOTE — Telephone Encounter (Signed)
Patty, Can you get him in for rov tomorrow afternoon, double book please.  Thanks

## 2015-12-15 NOTE — Telephone Encounter (Signed)
Patient notified and scheduled for 12/16/15 2:15

## 2015-12-16 ENCOUNTER — Encounter: Payer: Self-pay | Admitting: Gastroenterology

## 2015-12-16 ENCOUNTER — Ambulatory Visit (INDEPENDENT_AMBULATORY_CARE_PROVIDER_SITE_OTHER): Payer: BLUE CROSS/BLUE SHIELD | Admitting: Gastroenterology

## 2015-12-16 ENCOUNTER — Other Ambulatory Visit (INDEPENDENT_AMBULATORY_CARE_PROVIDER_SITE_OTHER): Payer: BLUE CROSS/BLUE SHIELD

## 2015-12-16 VITALS — BP 144/100 | HR 72 | Ht 66.0 in | Wt 218.0 lb

## 2015-12-16 DIAGNOSIS — A09 Infectious gastroenteritis and colitis, unspecified: Secondary | ICD-10-CM

## 2015-12-16 DIAGNOSIS — R197 Diarrhea, unspecified: Secondary | ICD-10-CM

## 2015-12-16 DIAGNOSIS — K51919 Ulcerative colitis, unspecified with unspecified complications: Secondary | ICD-10-CM

## 2015-12-16 LAB — CBC WITH DIFFERENTIAL/PLATELET
Basophils Absolute: 0 10*3/uL (ref 0.0–0.1)
Basophils Relative: 0.1 % (ref 0.0–3.0)
Eosinophils Absolute: 1.3 10*3/uL — ABNORMAL HIGH (ref 0.0–0.7)
Eosinophils Relative: 13.1 % — ABNORMAL HIGH (ref 0.0–5.0)
HCT: 37.4 % — ABNORMAL LOW (ref 39.0–52.0)
Hemoglobin: 12.6 g/dL — ABNORMAL LOW (ref 13.0–17.0)
Lymphocytes Relative: 17.1 % (ref 12.0–46.0)
Lymphs Abs: 1.7 10*3/uL (ref 0.7–4.0)
MCHC: 33.7 g/dL (ref 30.0–36.0)
MCV: 91.9 fl (ref 78.0–100.0)
Monocytes Absolute: 1 10*3/uL (ref 0.1–1.0)
Monocytes Relative: 10.1 % (ref 3.0–12.0)
Neutro Abs: 5.8 10*3/uL (ref 1.4–7.7)
Neutrophils Relative %: 59.6 % (ref 43.0–77.0)
Platelets: 442 10*3/uL — ABNORMAL HIGH (ref 150.0–400.0)
RBC: 4.07 Mil/uL — ABNORMAL LOW (ref 4.22–5.81)
RDW: 19 % — ABNORMAL HIGH (ref 11.5–15.5)
WBC: 9.8 10*3/uL (ref 4.0–10.5)

## 2015-12-16 LAB — HIGH SENSITIVITY CRP: CRP, High Sensitivity: 34.47 mg/L — ABNORMAL HIGH (ref 0.000–5.000)

## 2015-12-16 LAB — SEDIMENTATION RATE: Sed Rate: 30 mm/hr — ABNORMAL HIGH (ref 0–15)
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
## 2015-12-16 NOTE — Patient Instructions (Addendum)
You will be set up for a CT scan of abdomen and pelvis with IV and oral contrast (colitis, abd pain).  You have been scheduled for a CT scan of the abdomen and pelvis at [place] ([address]---this is in the same building as Press [facility]).   You are scheduled on 12/25/15 at 230 pm. You should arrive 15 minutes prior to your appointment time for registration. Please follow the written instructions below on the day of your exam:  WARNING: IF YOU ARE ALLERGIC TO IODINE/X-RAY DYE, PLEASE NOTIFY RADIOLOGY IMMEDIATELY AT [phone]! YOU WILL BE GIVEN A 13 HOUR PREMEDICATION PREP.  1) Do not eat or drink anything after 1030 am (4 hours prior to your test) 2) You have been given 2 bottles of oral contrast to drink. The solution may taste               better if refrigerated, but do NOT add ice or any other liquid to this solution. Shake             well before drinking.    Drink 1 bottle of contrast @ 1230 pm (2 hours prior to your exam)  Drink 1 bottle of contrast @ 130 pm (1 hour prior to your exam)  You may take any medications as prescribed with a small amount of water except for the following: Metformin, Glucophage, Glucovance, Avandamet, Riomet, Fortamet, Actoplus Met, Janumet, Glumetza or Metaglip. The above medications must be held the day of the exam AND 48 hours after the exam.  The purpose of you drinking the oral contrast is to aid in the visualization of your intestinal tract. The contrast solution may cause some diarrhea. Before your exam is started, you will be given a small amount of fluid to drink. Depending on your individual set of symptoms, you may also receive an intravenous injection of x-ray contrast/dye. Plan on being at [facility] for 30 minutes or longer, depending on the type of exam you are having performed.  This test typically takes 30-45 minutes to complete.  If you have any questions regarding your exam or if you need to reschedule, you  may call the CT department at [phone] between the hours of 8:00 am and 5:00 pm, Monday-Friday.  ________________________________________________________________________  Will discuss with insurance about remicade vs. Humira.  Please call if insurance changes.   Decrease 62m to 1080monce daily starting today.  You will have labs checked today in the basement lab.  Please head down after you check out with the front desk  ( cbc, sed rate, crp).

## 2015-12-17 ENCOUNTER — Telehealth: Payer: Self-pay | Admitting: Gastroenterology

## 2015-12-17 LAB — CLOSTRIDIUM DIFFICILE BY PCR: Toxigenic C. Difficile by PCR: DETECTED — CR

## 2015-12-17 MED ORDER — METRONIDAZOLE 500 MG PO TABS: 500.0000 mg | ORAL_TABLET | Freq: Three times a day (TID) | ORAL | 0 refills | Status: AC

## 2015-12-17 NOTE — Telephone Encounter (Signed)
Pt has been notified that I will begin working on which biologic is covered with his insurance.  I will call back and let him know which way we will go.

## 2015-12-17 NOTE — Telephone Encounter (Signed)
Milus Banister, MD  Barron Alvine, RN          Please call the patient. C diff was positive. Please call him. He needs flagyl 534m pill one pill tid for 14 days. Lets put a hold on moving ahead with TNF inhibitor. He needs to call the office on tues, wed next week to update on his progress. Thanks

## 2015-12-17 NOTE — Telephone Encounter (Signed)
Call placed to [facility] system is down and I was asked to call back tomorrow.    Pt called and made aware that the TNF inhibitor has been put on hold and he will start on flagyl.  He will call next week and update.  Flagyl sent to the pharmacy.  Pt advised to avoid all ETOH

## 2015-12-25 ENCOUNTER — Ambulatory Visit (INDEPENDENT_AMBULATORY_CARE_PROVIDER_SITE_OTHER)
Admission: RE | Admit: 2015-12-25 | Discharge: 2015-12-25 | Disposition: A | Discharge status: Home / Self Care | Payer: BLUE CROSS/BLUE SHIELD | Source: Ambulatory Visit | Attending: Gastroenterology | Admitting: Gastroenterology

## 2015-12-25 DIAGNOSIS — R197 Diarrhea, unspecified: Secondary | ICD-10-CM | POA: Diagnosis not present

## 2015-12-25 DIAGNOSIS — K51919 Ulcerative colitis, unspecified with unspecified complications: Secondary | ICD-10-CM

## 2015-12-25 MED ORDER — IOPAMIDOL (ISOVUE-300) INJECTION 61%
100.0000 mL | Freq: Once | INTRAVENOUS | Status: AC | PRN
  Administered 2015-12-25: 100 mL via INTRAVENOUS

## 2016-01-01 ENCOUNTER — Other Ambulatory Visit: Payer: Self-pay | Admitting: Internal Medicine

## 2016-01-12 ENCOUNTER — Encounter: Payer: Self-pay | Admitting: Gastroenterology

## 2016-01-12 ENCOUNTER — Ambulatory Visit (INDEPENDENT_AMBULATORY_CARE_PROVIDER_SITE_OTHER): Payer: BLUE CROSS/BLUE SHIELD | Admitting: Gastroenterology

## 2016-01-12 VITALS — BP 130/90 | HR 66 | Ht 66.0 in | Wt 215.0 lb

## 2016-01-12 DIAGNOSIS — K5100 Ulcerative (chronic) pancolitis without complications: Secondary | ICD-10-CM

## 2016-01-12 DIAGNOSIS — K51919 Ulcerative colitis, unspecified with unspecified complications: Secondary | ICD-10-CM

## 2016-01-12 NOTE — Patient Instructions (Addendum)
You will have labs checked today in the basement lab.  Please head down after you check out with the front desk  (cbc, cmet, esr). We will help with new prescription: either 245m azathiaprine once daily OR 1012m65m82mnce daily.  One month 6 refills. Please return to see Dr. JacArdis Hughs 03/30/16 8:45 am
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
## 2016-01-27 ENCOUNTER — Ambulatory Visit: Payer: BLUE CROSS/BLUE SHIELD | Admitting: Internal Medicine

## 2016-02-01 ENCOUNTER — Ambulatory Visit (INDEPENDENT_AMBULATORY_CARE_PROVIDER_SITE_OTHER): Payer: BLUE CROSS/BLUE SHIELD | Admitting: Internal Medicine

## 2016-02-01 ENCOUNTER — Encounter: Payer: Self-pay | Admitting: Internal Medicine

## 2016-02-01 VITALS — BP 124/80 | HR 67 | Temp 98.1°F | Resp 12 | Ht 66.0 in | Wt 212.4 lb

## 2016-02-01 DIAGNOSIS — K51911 Ulcerative colitis, unspecified with rectal bleeding: Secondary | ICD-10-CM

## 2016-02-01 DIAGNOSIS — M25512 Pain in left shoulder: Secondary | ICD-10-CM | POA: Diagnosis not present

## 2016-02-01 NOTE — Patient Instructions (Signed)
IBUPROFEN (Advil or Motrin) 200 mg 2 tablets every 6 hours as needed for pain.  Always take it with food because may cause gastritis and ulcers.  If you notice nausea, stomach pain, change in the color of stools --->  Stop the medicine and let us know

## 2016-02-01 NOTE — Progress Notes (Signed)
Pre visit review using our clinic review tool, if applicable. No additional management support is needed unless otherwise documented below in the visit note. 
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
## 2016-02-01 NOTE — Assessment & Plan Note (Signed)
Left shoulder pain: For few weeks, we discussed conservative treatment with NSAIDs and self PT versus referral. He has a history of surgery in the shoulder, likes further eval --->  refer to sports medicine. Ulcerative colitis: Having problems getting mercaptopurine, recommend to discuss with GI as he is doing.

## 2016-02-09 ENCOUNTER — Telehealth: Payer: Self-pay | Admitting: Gastroenterology

## 2016-02-09 ENCOUNTER — Encounter: Payer: Self-pay | Admitting: Family Medicine

## 2016-02-09 ENCOUNTER — Ambulatory Visit (HOSPITAL_BASED_OUTPATIENT_CLINIC_OR_DEPARTMENT_OTHER)
Admission: RE | Admit: 2016-02-09 | Discharge: 2016-02-09 | Disposition: A | Discharge status: Home / Self Care | Payer: BLUE CROSS/BLUE SHIELD | Source: Ambulatory Visit | Attending: Family Medicine | Admitting: Family Medicine

## 2016-02-09 ENCOUNTER — Ambulatory Visit (INDEPENDENT_AMBULATORY_CARE_PROVIDER_SITE_OTHER): Payer: BLUE CROSS/BLUE SHIELD | Admitting: Family Medicine

## 2016-02-09 VITALS — BP 163/103 | HR 68 | Ht 66.0 in | Wt 212.0 lb

## 2016-02-09 DIAGNOSIS — M25512 Pain in left shoulder: Secondary | ICD-10-CM | POA: Diagnosis not present

## 2016-02-09 NOTE — Patient Instructions (Signed)
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 Aleve 2 tabs twice a day with food OR ibuprofen 3 tabs three times a day with food for pain and inflammation. Can take tylenol in addition to this if needed. AC joint injection is an option in the future. Consider physical therapy with transition to home exercise program. Do home exercise program with theraband and scapular stabilization exercises, pectoralis flys, bench press 3 sets of 10 once a day for next 6 weeks. If not improving at follow-up we will consider further imaging, injection, physical therapy, and/or nitro patches. Follow up with me in 6 weeks or as needed.

## 2016-02-09 NOTE — Telephone Encounter (Signed)
Pt has been given the information for [email or web address] to down load a savings card.  He will call back with any problems

## 2016-02-14 DIAGNOSIS — M25512 Pain in left shoulder: Secondary | ICD-10-CM | POA: Insufficient documentation
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
## 2016-02-14 NOTE — Assessment & Plan Note (Signed)
due to a combination of AC arthritis, pec major weakness, and rotator cuff impingement.  He will start with home exercises, tylenol, aleve or ibuprofen.  He will consider AC or subacromial injection, physical therapy, nitro patches.  F/u in 6 weeks or prn.

## 2016-03-23 ENCOUNTER — Ambulatory Visit: Payer: BLUE CROSS/BLUE SHIELD | Admitting: Family Medicine

## 2016-03-30 ENCOUNTER — Ambulatory Visit: Payer: BLUE CROSS/BLUE SHIELD | Admitting: Gastroenterology

## 2016-05-31 ENCOUNTER — Ambulatory Visit: Payer: BLUE CROSS/BLUE SHIELD | Admitting: Gastroenterology

## 2016-06-23 ENCOUNTER — Ambulatory Visit (INDEPENDENT_AMBULATORY_CARE_PROVIDER_SITE_OTHER): Payer: BLUE CROSS/BLUE SHIELD | Admitting: Internal Medicine

## 2016-06-23 ENCOUNTER — Encounter: Payer: Self-pay | Admitting: Internal Medicine

## 2016-06-23 VITALS — BP 142/88 | HR 58 | Temp 98.1°F | Resp 14 | Ht 66.0 in | Wt 221.0 lb

## 2016-06-23 DIAGNOSIS — K51911 Ulcerative colitis, unspecified with rectal bleeding: Secondary | ICD-10-CM

## 2016-06-23 DIAGNOSIS — R519 Headache, unspecified: Secondary | ICD-10-CM

## 2016-06-23 DIAGNOSIS — R51 Headache: Secondary | ICD-10-CM

## 2016-06-23 DIAGNOSIS — G43009 Migraine without aura, not intractable, without status migrainosus: Secondary | ICD-10-CM

## 2016-06-23 DIAGNOSIS — G8929 Other chronic pain: Secondary | ICD-10-CM

## 2016-06-23 LAB — CBC WITH DIFFERENTIAL/PLATELET
Basophils Absolute: 0 10*3/uL (ref 0.0–0.1)
Basophils Relative: 0.3 % (ref 0.0–3.0)
Eosinophils Absolute: 0.3 10*3/uL (ref 0.0–0.7)
Eosinophils Relative: 3.3 % (ref 0.0–5.0)
HCT: 46.1 % (ref 39.0–52.0)
Hemoglobin: 15.1 g/dL (ref 13.0–17.0)
Lymphocytes Relative: 18.7 % (ref 12.0–46.0)
Lymphs Abs: 1.6 10*3/uL (ref 0.7–4.0)
MCHC: 32.7 g/dL (ref 30.0–36.0)
MCV: 94.2 fl (ref 78.0–100.0)
Monocytes Absolute: 0.8 10*3/uL (ref 0.1–1.0)
Monocytes Relative: 9 % (ref 3.0–12.0)
Neutro Abs: 5.8 10*3/uL (ref 1.4–7.7)
Neutrophils Relative %: 68.7 % (ref 43.0–77.0)
Platelets: 341 10*3/uL (ref 150.0–400.0)
RBC: 4.89 Mil/uL (ref 4.22–5.81)
RDW: 14.1 % (ref 11.5–15.5)
WBC: 8.5 10*3/uL (ref 4.0–10.5)

## 2016-06-23 LAB — COMPREHENSIVE METABOLIC PANEL
ALT: 19 U/L (ref 0–53)
AST: 16 U/L (ref 0–37)
Albumin: 4.1 g/dL (ref 3.5–5.2)
Alkaline Phosphatase: 87 U/L (ref 39–117)
BUN: 10 mg/dL (ref 6–23)
CO2: 31 mEq/L (ref 19–32)
Calcium: 9.1 mg/dL (ref 8.4–10.5)
Chloride: 105 mEq/L (ref 96–112)
Creatinine, Ser: 1.13 mg/dL (ref 0.40–1.50)
GFR: 91.82 mL/min (ref >60.00)
Glucose, Bld: 85 mg/dL (ref 70–99)
Potassium: 3.5 mEq/L (ref 3.5–5.1)
Sodium: 142 mEq/L (ref 135–145)
Total Bilirubin: 0.7 mg/dL (ref 0.2–1.2)
Total Protein: 7.5 g/dL (ref 6.0–8.3)

## 2016-06-23 LAB — FERRITIN: Ferritin: 15.1 ng/mL — ABNORMAL LOW (ref 22.0–322.0)

## 2016-06-23 LAB — FOLATE: Folate: 8.6 ng/mL (ref >5.9)

## 2016-06-23 LAB — IRON: Iron: 74 ug/dL (ref 42–165)

## 2016-06-23 LAB — VITAMIN B12: Vitamin B-12: 654 pg/mL (ref 211–911)

## 2016-06-23 MED ORDER — TOPIRAMATE 25 MG PO TABS: 25.0000 mg | ORAL_TABLET | Freq: Two times a day (BID) | ORAL | 1 refills | Status: DC

## 2016-06-23 NOTE — Patient Instructions (Signed)
GO TO THE LAB : Get the blood work     GO TO THE FRONT DESK Schedule your next appointment for a  checkup in 3 months  For headaches: Start Topamax 1 tablet twice a day. If you have a severe headache, rest, drink plenty of fluids and take Tylenol 500 mg 2 tablets every 6 hours as needed We are referring you to neurology Go to the ER if the headache is very intense and not responding to treatment.

## 2016-06-23 NOTE — Progress Notes (Signed)
Pre visit review using our clinic review tool, if applicable. No additional management support is needed unless otherwise documented below in the visit note. 
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
## 2016-06-24 NOTE — Assessment & Plan Note (Signed)
HAs: Has apparently two type of headaches, one is classic for migraine the other is not. Tension HA?Marland Kitchen Plan: Refer to neurology, ER if symptoms severe, start Topamax, Tylenol for acute headaches. Ulcerative colitis: Having severe sxs, will check a CMP, CBC, iron, J08 and folic acid. Has an appointment for April 27, will ask GI to see sooner if possible per pt request  RTC 3 months.

## 2016-06-27 ENCOUNTER — Telehealth: Payer: Self-pay

## 2016-06-27 DIAGNOSIS — K51919 Ulcerative colitis, unspecified with unspecified complications: Secondary | ICD-10-CM

## 2016-06-27 NOTE — Telephone Encounter (Signed)
Pt has been advised to come in for labs and stool studies.
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
## 2016-06-30 ENCOUNTER — Other Ambulatory Visit (INDEPENDENT_AMBULATORY_CARE_PROVIDER_SITE_OTHER): Payer: BLUE CROSS/BLUE SHIELD

## 2016-06-30 DIAGNOSIS — K51919 Ulcerative colitis, unspecified with unspecified complications: Secondary | ICD-10-CM | POA: Diagnosis not present

## 2016-06-30 LAB — COMPREHENSIVE METABOLIC PANEL
ALT: 16 U/L (ref 0–53)
AST: 13 U/L (ref 0–37)
Albumin: 4 g/dL (ref 3.5–5.2)
Alkaline Phosphatase: 74 U/L (ref 39–117)
BUN: 11 mg/dL (ref 6–23)
CO2: 31 mEq/L (ref 19–32)
Calcium: 8.8 mg/dL (ref 8.4–10.5)
Chloride: 104 mEq/L (ref 96–112)
Creatinine, Ser: 1.19 mg/dL (ref 0.40–1.50)
GFR: 86.49 mL/min (ref >60.00)
Glucose, Bld: 109 mg/dL — ABNORMAL HIGH (ref 70–99)
Potassium: 3.8 mEq/L (ref 3.5–5.1)
Sodium: 141 mEq/L (ref 135–145)
Total Bilirubin: 0.9 mg/dL (ref 0.2–1.2)
Total Protein: 7.3 g/dL (ref 6.0–8.3)

## 2016-06-30 LAB — CBC WITH DIFFERENTIAL/PLATELET
Basophils Absolute: 0 10*3/uL (ref 0.0–0.1)
Basophils Relative: 0.2 % (ref 0.0–3.0)
Eosinophils Absolute: 0.2 10*3/uL (ref 0.0–0.7)
Eosinophils Relative: 2.4 % (ref 0.0–5.0)
HCT: 44.9 % (ref 39.0–52.0)
Hemoglobin: 15 g/dL (ref 13.0–17.0)
Lymphocytes Relative: 19.2 % (ref 12.0–46.0)
Lymphs Abs: 1.8 10*3/uL (ref 0.7–4.0)
MCHC: 33.4 g/dL (ref 30.0–36.0)
MCV: 92.8 fl (ref 78.0–100.0)
Monocytes Absolute: 0.9 10*3/uL (ref 0.1–1.0)
Monocytes Relative: 9.4 % (ref 3.0–12.0)
Neutro Abs: 6.5 10*3/uL (ref 1.4–7.7)
Neutrophils Relative %: 68.8 % (ref 43.0–77.0)
Platelets: 279 10*3/uL (ref 150.0–400.0)
RBC: 4.83 Mil/uL (ref 4.22–5.81)
RDW: 14.6 % (ref 11.5–15.5)
WBC: 9.5 10*3/uL (ref 4.0–10.5)

## 2016-06-30 LAB — SEDIMENTATION RATE: Sed Rate: 23 mm/hr — ABNORMAL HIGH (ref 0–15)

## 2016-07-06 LAB — THIOPURINE METHYLTRANSFERASE (TPMT), RBC: Thiopurine Methyltransferase, RBC: 15 nmol/hr/mL RBC

## 2016-07-13 ENCOUNTER — Other Ambulatory Visit: Payer: BLUE CROSS/BLUE SHIELD

## 2016-07-13 DIAGNOSIS — G43909 Migraine, unspecified, not intractable, without status migrainosus: Secondary | ICD-10-CM

## 2016-07-14 ENCOUNTER — Other Ambulatory Visit: Payer: BLUE CROSS/BLUE SHIELD

## 2016-07-14 DIAGNOSIS — G43909 Migraine, unspecified, not intractable, without status migrainosus: Secondary | ICD-10-CM

## 2016-07-15 ENCOUNTER — Encounter: Payer: Self-pay | Admitting: Gastroenterology

## 2016-07-15 ENCOUNTER — Ambulatory Visit (INDEPENDENT_AMBULATORY_CARE_PROVIDER_SITE_OTHER): Payer: BLUE CROSS/BLUE SHIELD | Admitting: Gastroenterology

## 2016-07-15 VITALS — BP 126/68 | HR 68 | Ht 66.0 in | Wt 222.0 lb

## 2016-07-15 DIAGNOSIS — R197 Diarrhea, unspecified: Secondary | ICD-10-CM | POA: Diagnosis not present

## 2016-07-15 DIAGNOSIS — K51919 Ulcerative colitis, unspecified with unspecified complications: Secondary | ICD-10-CM

## 2016-07-15 MED ORDER — AZATHIOPRINE 100 MG PO TABS: 200.0000 mg | ORAL_TABLET | Freq: Every day | ORAL | 5 refills | Status: AC

## 2016-07-15 MED ORDER — PREDNISONE 10 MG PO TABS: 20.0000 mg | ORAL_TABLET | Freq: Two times a day (BID) | ORAL | 0 refills | Status: DC

## 2016-07-15 NOTE — Patient Instructions (Addendum)
Prednisone 11m twice daily (159mpills).  Tapering regimen by 10 mg every week (in 4-5 weeks you will be OFF prednisone).  Azathiaprine 200 mg once daily (disp 30 days with 5 refills).  Await stool test for C. Diff.  If positive then will stop the steroids.  Please return to see Dr. JaArdis Hughsn 08/16/16 at 2:15 pm, sooner if needed (double book if needed).  CBC/cmet in 10 days.
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
## 2016-07-16 LAB — CLOSTRIDIUM DIFFICILE EIA: C difficile Toxins A+B, EIA: NEGATIVE

## 2016-07-20 ENCOUNTER — Encounter: Payer: Self-pay | Admitting: Internal Medicine

## 2016-08-02 ENCOUNTER — Other Ambulatory Visit: Payer: Self-pay | Admitting: Gastroenterology

## 2016-08-16 ENCOUNTER — Ambulatory Visit: Payer: BLUE CROSS/BLUE SHIELD | Admitting: Gastroenterology

## 2016-10-03 ENCOUNTER — Ambulatory Visit: Payer: BLUE CROSS/BLUE SHIELD | Admitting: Internal Medicine

## 2016-10-05 ENCOUNTER — Other Ambulatory Visit (INDEPENDENT_AMBULATORY_CARE_PROVIDER_SITE_OTHER): Payer: BLUE CROSS/BLUE SHIELD

## 2016-10-05 ENCOUNTER — Ambulatory Visit (INDEPENDENT_AMBULATORY_CARE_PROVIDER_SITE_OTHER): Payer: BLUE CROSS/BLUE SHIELD | Admitting: Gastroenterology

## 2016-10-05 ENCOUNTER — Other Ambulatory Visit: Payer: Self-pay

## 2016-10-05 ENCOUNTER — Encounter: Payer: Self-pay | Admitting: Gastroenterology

## 2016-10-05 VITALS — BP 144/100 | HR 68 | Ht 66.5 in | Wt 223.5 lb

## 2016-10-05 DIAGNOSIS — K51919 Ulcerative colitis, unspecified with unspecified complications: Secondary | ICD-10-CM | POA: Diagnosis not present

## 2016-10-05 LAB — CBC WITH DIFFERENTIAL/PLATELET
Basophils Absolute: 0 10*3/uL (ref 0.0–0.1)
Basophils Relative: 0.4 % (ref 0.0–3.0)
Eosinophils Absolute: 0.2 10*3/uL (ref 0.0–0.7)
Eosinophils Relative: 2.8 % (ref 0.0–5.0)
HCT: 43.4 % (ref 39.0–52.0)
Hemoglobin: 14.6 g/dL (ref 13.0–17.0)
Lymphocytes Relative: 17.5 % (ref 12.0–46.0)
Lymphs Abs: 1.5 10*3/uL (ref 0.7–4.0)
MCHC: 33.7 g/dL (ref 30.0–36.0)
MCV: 95.6 fl (ref 78.0–100.0)
Monocytes Absolute: 0.9 10*3/uL (ref 0.1–1.0)
Monocytes Relative: 10.3 % (ref 3.0–12.0)
Neutro Abs: 5.9 10*3/uL (ref 1.4–7.7)
Neutrophils Relative %: 69 % (ref 43.0–77.0)
Platelets: 328 10*3/uL (ref 150.0–400.0)
RBC: 4.54 Mil/uL (ref 4.22–5.81)
RDW: 16.1 % — ABNORMAL HIGH (ref 11.5–15.5)
WBC: 8.5 10*3/uL (ref 4.0–10.5)

## 2016-10-05 LAB — COMPREHENSIVE METABOLIC PANEL
ALT: 10 U/L (ref 0–53)
AST: 14 U/L (ref 0–37)
Albumin: 4.1 g/dL (ref 3.5–5.2)
Alkaline Phosphatase: 87 U/L (ref 39–117)
BUN: 12 mg/dL (ref 6–23)
CO2: 27 mEq/L (ref 19–32)
Calcium: 8.9 mg/dL (ref 8.4–10.5)
Chloride: 104 mEq/L (ref 96–112)
Creatinine, Ser: 1.21 mg/dL (ref 0.40–1.50)
GFR: 84.74 mL/min (ref >60.00)
Glucose, Bld: 112 mg/dL — ABNORMAL HIGH (ref 70–99)
Potassium: 3.6 mEq/L (ref 3.5–5.1)
Sodium: 140 mEq/L (ref 135–145)
Total Bilirubin: 0.8 mg/dL (ref 0.2–1.2)
Total Protein: 7.1 g/dL (ref 6.0–8.3)

## 2016-10-05 NOTE — Patient Instructions (Addendum)
You will have labs checked today in the basement lab.  Please head down after you check out with the front desk  (cbc, cmet). You will need labs like this every 3-4 months.  Please return to see Dr. Ardis Hughs in 6 months.  Stay on lialda and azathiaprine at your current doses.  Normal BMI (Body Mass Index- based on height and weight) is between 19 and 25. Your BMI today is Body mass index is 35.53 kg/m. Marland Kitchen Please consider follow up  regarding your BMI with your Primary Care Provider.
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
## 2016-11-12 ENCOUNTER — Other Ambulatory Visit: Payer: Self-pay | Admitting: Gastroenterology

## 2016-11-15 ENCOUNTER — Telehealth: Payer: Self-pay | Admitting: Gastroenterology

## 2016-11-15 NOTE — Telephone Encounter (Signed)
prescription refilled 11/12/16 by Chong Sicilian.

## 2016-11-21 NOTE — Progress Notes (Signed)
error 

## 2016-11-22 ENCOUNTER — Encounter: Payer: Self-pay | Admitting: Internal Medicine

## 2016-11-22 ENCOUNTER — Ambulatory Visit (INDEPENDENT_AMBULATORY_CARE_PROVIDER_SITE_OTHER): Payer: BLUE CROSS/BLUE SHIELD | Admitting: Internal Medicine

## 2016-11-22 VITALS — BP 130/78 | HR 68 | Temp 98.1°F | Resp 14 | Ht 67.0 in | Wt 227.2 lb

## 2016-11-22 DIAGNOSIS — Z0000 Encounter for general adult medical examination without abnormal findings: Secondary | ICD-10-CM

## 2016-11-22 DIAGNOSIS — Z23 Encounter for immunization: Secondary | ICD-10-CM | POA: Diagnosis not present

## 2016-11-22 DIAGNOSIS — D849 Immunodeficiency, unspecified: Secondary | ICD-10-CM | POA: Diagnosis not present

## 2016-11-22 DIAGNOSIS — D899 Disorder involving the immune mechanism, unspecified: Secondary | ICD-10-CM

## 2016-11-22 LAB — HEMOGLOBIN A1C: Hgb A1c MFr Bld: 5.9 % (ref 4.6–6.5)

## 2016-11-22 NOTE — Assessment & Plan Note (Signed)
Hyperglycemia: Last A1c 6.4. Patient aware he has "prediabetes". Recheck a A1C HTN: Seems well-controlled on losartan and metoprolol. Recent BMP satisfactory Ulcerative colitis: Currently well controlled Fluctuating weight per pt report: Weight is stable per our scales, related to  changing diet, change physical activity (not doing a sedentary job anymore) , medications ?Marland Kitchen Rec  to weight himself frequently in the same scale. RTC 6 months

## 2016-11-22 NOTE — Progress Notes (Signed)
Pre visit review using our clinic review tool, if applicable. No additional management support is needed unless otherwise documented below in the visit note. 

## 2016-11-22 NOTE — Assessment & Plan Note (Addendum)
-  Td 2010;  Pt takes immunosuppressants:prevnar today, pnm 23 in 2 months (w/ pnm 23 booster in 5 years) Plans to get a flu shot this season. -Labs reviewed. Due for a A1c. He is not fasting, check a FLP on RTC  Diet and exercise: Discussed.

## 2016-11-22 NOTE — Patient Instructions (Signed)
GO TO THE LAB : Get the blood work     GO TO THE FRONT DESK Schedule your next appointment for a  checkup in 6 months   Please call 2 months from today for a nurse appointment, you need a pneumonia shot  (and a flu shot )
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
## 2016-12-16 ENCOUNTER — Telehealth: Payer: Self-pay | Admitting: Internal Medicine

## 2016-12-16 ENCOUNTER — Other Ambulatory Visit: Payer: Self-pay | Admitting: Internal Medicine

## 2016-12-16 NOTE — Telephone Encounter (Signed)
Relation to VK:[facility] Call back [phone] Pharmacy: [place], [place]. [phone] (Phone) [phone] (Fax)     Reason for call:  Patient requesting 90 day supply metoprolol (LOPRESSOR) 50 MG tablet and losartan (COZAAR) 50 MG tablet

## 2016-12-16 NOTE — Telephone Encounter (Signed)
Both meds refilled earlier today.

## 2017-01-24 ENCOUNTER — Ambulatory Visit: Payer: BLUE CROSS/BLUE SHIELD

## 2017-05-02 ENCOUNTER — Ambulatory Visit (INDEPENDENT_AMBULATORY_CARE_PROVIDER_SITE_OTHER): Payer: Managed Care, Other (non HMO) | Admitting: Gastroenterology

## 2017-05-02 ENCOUNTER — Encounter: Payer: Self-pay | Admitting: Gastroenterology

## 2017-05-02 ENCOUNTER — Other Ambulatory Visit (INDEPENDENT_AMBULATORY_CARE_PROVIDER_SITE_OTHER): Payer: Managed Care, Other (non HMO)

## 2017-05-02 VITALS — BP 120/78 | HR 70

## 2017-05-02 DIAGNOSIS — K51911 Ulcerative colitis, unspecified with rectal bleeding: Secondary | ICD-10-CM | POA: Diagnosis not present

## 2017-05-02 LAB — CBC WITH DIFFERENTIAL/PLATELET
Basophils Absolute: 0 10*3/uL (ref 0.0–0.1)
Basophils Relative: 0.4 % (ref 0.0–3.0)
Eosinophils Absolute: 0.2 10*3/uL (ref 0.0–0.7)
Eosinophils Relative: 2 % (ref 0.0–5.0)
HCT: 45.4 % (ref 39.0–52.0)
Hemoglobin: 15.3 g/dL (ref 13.0–17.0)
Lymphocytes Relative: 20.2 % (ref 12.0–46.0)
Lymphs Abs: 1.6 10*3/uL (ref 0.7–4.0)
MCHC: 33.7 g/dL (ref 30.0–36.0)
MCV: 97.9 fl (ref 78.0–100.0)
Monocytes Absolute: 0.8 10*3/uL (ref 0.1–1.0)
Monocytes Relative: 9.7 % (ref 3.0–12.0)
Neutro Abs: 5.4 10*3/uL (ref 1.4–7.7)
Neutrophils Relative %: 67.7 % (ref 43.0–77.0)
Platelets: 360 10*3/uL (ref 150.0–400.0)
RBC: 4.64 Mil/uL (ref 4.22–5.81)
RDW: 15 % (ref 11.5–15.5)
WBC: 8 10*3/uL (ref 4.0–10.5)

## 2017-05-02 LAB — COMPREHENSIVE METABOLIC PANEL
ALT: 19 U/L (ref 0–53)
AST: 16 U/L (ref 0–37)
Albumin: 4.1 g/dL (ref 3.5–5.2)
Alkaline Phosphatase: 80 U/L (ref 39–117)
BUN: 11 mg/dL (ref 6–23)
CO2: 30 mEq/L (ref 19–32)
Calcium: 8.9 mg/dL (ref 8.4–10.5)
Chloride: 105 mEq/L (ref 96–112)
Creatinine, Ser: 1.2 mg/dL (ref 0.40–1.50)
GFR: 85.31 mL/min (ref >60.00)
Glucose, Bld: 110 mg/dL — ABNORMAL HIGH (ref 70–99)
Potassium: 4.6 mEq/L (ref 3.5–5.1)
Sodium: 141 mEq/L (ref 135–145)
Total Bilirubin: 0.6 mg/dL (ref 0.2–1.2)
Total Protein: 7.3 g/dL (ref 6.0–8.3)

## 2017-05-02 NOTE — Patient Instructions (Addendum)
You will have labs checked today in the basement lab.  Please head down after you check out with the front desk  (cbc, cmet)  Please return to see Dr. Ardis Hughs in 6 months.  Normal BMI (Body Mass Index- based on height and weight) is between 19 and 25. Your BMI today is There is no height or weight on file to calculate BMI. Marland Kitchen Please consider follow up  regarding your BMI with your Primary Care Provider.
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
## 2017-05-23 ENCOUNTER — Ambulatory Visit (INDEPENDENT_AMBULATORY_CARE_PROVIDER_SITE_OTHER): Payer: Managed Care, Other (non HMO) | Admitting: Internal Medicine

## 2017-05-23 ENCOUNTER — Encounter: Payer: Self-pay | Admitting: Internal Medicine

## 2017-05-23 VITALS — BP 126/72 | HR 76 | Temp 98.1°F | Resp 14 | Ht 67.0 in | Wt 226.5 lb

## 2017-05-23 DIAGNOSIS — I10 Essential (primary) hypertension: Secondary | ICD-10-CM | POA: Diagnosis not present

## 2017-05-23 DIAGNOSIS — R739 Hyperglycemia, unspecified: Secondary | ICD-10-CM

## 2017-05-23 LAB — HEMOGLOBIN A1C: Hgb A1c MFr Bld: 5.9 % (ref 4.6–6.5)

## 2017-05-23 LAB — LIPID PANEL
Cholesterol: 138 mg/dL (ref 0–200)
HDL: 50.2 mg/dL (ref >39.00)
LDL Cholesterol: 83 mg/dL (ref 0–99)
NonHDL: 88.01
Total CHOL/HDL Ratio: 3
Triglycerides: 27 mg/dL (ref 0.0–149.0)
VLDL: 5.4 mg/dL (ref 0.0–40.0)

## 2017-05-23 MED ORDER — LOSARTAN POTASSIUM 50 MG PO TABS: 50.0000 mg | ORAL_TABLET | Freq: Every day | ORAL | 2 refills | Status: DC
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
## 2017-05-23 NOTE — Patient Instructions (Addendum)
GO TO THE LAB : Get the blood work     GO TO THE FRONT DESK Schedule your next appointment for a physical exam by September 2019

## 2017-05-23 NOTE — Assessment & Plan Note (Signed)
Hyperglycemia: Last A1c improved after he modified his diet, he continue to eat healthy, physically active at work.  Check a A1c and FLP HTN: Seems well controlled with losartan and metoprolol, last BMP satisfactory, was unable to get losartan at his local pharmacy due to a recall, send a new prescription to our pharmacy to see if that is available. Declined  pneumonia shot 23 for now. RTC 11-2017 CPX

## 2017-05-23 NOTE — Progress Notes (Signed)
Pre visit review using our clinic review tool, if applicable. No additional management support is needed unless otherwise documented below in the visit note. 

## 2017-06-01 ENCOUNTER — Ambulatory Visit (INDEPENDENT_AMBULATORY_CARE_PROVIDER_SITE_OTHER): Payer: Managed Care, Other (non HMO)

## 2017-06-01 DIAGNOSIS — Z23 Encounter for immunization: Secondary | ICD-10-CM | POA: Diagnosis not present

## 2017-06-01 NOTE — Progress Notes (Signed)
Patient came in today to have his flu shot done. He tolerated it well in his left arm.  [name], MD

## 2017-06-07 ENCOUNTER — Telehealth: Payer: Self-pay | Admitting: Internal Medicine

## 2017-06-07 DIAGNOSIS — I10 Essential (primary) hypertension: Secondary | ICD-10-CM

## 2017-06-07 MED ORDER — TRIAMTERENE-HCTZ 37.5-25 MG PO TABS: 1.0000 | ORAL_TABLET | Freq: Every day | ORAL | 0 refills | Status: DC

## 2017-06-07 NOTE — Telephone Encounter (Addendum)
Advised patient: Stop losartan I already sent a prescription for Maxzide,  1 tablet in the morning, it is a diuretic. Please arrange for a BMP in 2 weeks. Monitor BPs to be sure the new medication is working.

## 2017-06-07 NOTE — Addendum Note (Signed)
Addended by: [name] E on: 06/07/2017 03:30 PM   Modules accepted: Orders

## 2017-06-07 NOTE — Telephone Encounter (Signed)
Pt.'s mobile number called x 2 and the individual that answers says we have the wrong number. Left a message on home phone number that is in chart, to call back to discuss pt.'s medication.

## 2017-06-07 NOTE — Addendum Note (Signed)
Addended byDamita Dunnings D on: 06/07/2017 04:28 PM   Modules accepted: Orders

## 2017-06-07 NOTE — Telephone Encounter (Signed)
Copied from [place] [phone]. Topic: Quick Communication - Rx Refill/Question >> Jun 07, 2017 12:52 PM Oliver Pila B wrote: Medication: losartan (COZAAR) 50 MG tablet [[id number]]  Pt called to speak w/ the nurse of Larose Kells about the medication above and is wanting to speak w/ someone about getting it changed, contact pt to advise

## 2017-06-07 NOTE — Telephone Encounter (Signed)
Tried calling Pt- again wrong number. PEC when Pt calls back- please get correct number and schedule lab appt for 2 weeks.

## 2017-06-07 NOTE — Telephone Encounter (Signed)
Please advise 

## 2017-06-07 NOTE — Telephone Encounter (Addendum)
Wife states pt doesn't feel comfortable taking losartan anymore. Due to all the recalls, pt prefers not to deal with any of the losartan.  Wife is a Marine scientist and states she has been checking his bp, and his bp has been running on the high side. His bp today was 158/102. Pt has not been taking the losartan at all. Pt is at work now.  Pt hoping Dr Larose Kells will call in something else asap. Wife is hoping to pick up this afternoon.  [place], [place]. [phone] (Phone) [phone] (Fax)

## 2017-06-08 ENCOUNTER — Encounter: Payer: Self-pay | Admitting: Internal Medicine

## 2017-06-08 ENCOUNTER — Ambulatory Visit: Payer: Managed Care, Other (non HMO) | Admitting: Internal Medicine

## 2017-06-28 ENCOUNTER — Telehealth: Payer: Self-pay

## 2017-06-28 NOTE — Telephone Encounter (Signed)
MyChart message sent. BMP already in chart pended.

## 2017-06-28 NOTE — Telephone Encounter (Signed)
-----   Message from Colon Branch, MD sent at 06/28/2017 10:10 AM EDT ----- Regarding: send a message Due for a BMP, needs to schedule at his convenience, no fasting

## 2017-07-28 ENCOUNTER — Other Ambulatory Visit: Payer: Self-pay | Admitting: Gastroenterology

## 2017-08-01 MED FILL — METOPROLOL TARTRATE 50 MG T: 50 | 30 days supply | Qty: 90 | Fill #0

## 2017-08-01 MED FILL — LOSARTAN POTASSIUM 50 MG TA: 50 | 30 days supply | Qty: 30 | Fill #0

## 2017-08-01 MED FILL — azaTHIOprine 50 MG TABS: 50 | 30 days supply | Qty: 120 | Fill #0

## 2017-08-01 MED FILL — LIALDA 1.2 GM TABLET SA: 1.2 | 30 days supply | Qty: 120 | Fill #0

## 2017-08-18 ENCOUNTER — Encounter: Payer: Self-pay | Admitting: Internal Medicine

## 2017-08-18 ENCOUNTER — Ambulatory Visit (INDEPENDENT_AMBULATORY_CARE_PROVIDER_SITE_OTHER): Payer: 59 | Admitting: Internal Medicine

## 2017-08-18 VITALS — BP 126/80 | HR 69 | Temp 97.9°F | Resp 14 | Ht 67.0 in | Wt 230.4 lb

## 2017-08-18 DIAGNOSIS — Z23 Encounter for immunization: Secondary | ICD-10-CM

## 2017-08-18 DIAGNOSIS — J45909 Unspecified asthma, uncomplicated: Secondary | ICD-10-CM

## 2017-08-18 DIAGNOSIS — Z0000 Encounter for general adult medical examination without abnormal findings: Secondary | ICD-10-CM | POA: Diagnosis not present

## 2017-08-18 LAB — BASIC METABOLIC PANEL
BUN: 14 mg/dL (ref 7–25)
CO2: 29 mmol/L (ref 20–32)
Calcium: 9.2 mg/dL (ref 8.6–10.3)
Chloride: 106 mmol/L (ref 98–110)
Creat: 1.19 mg/dL (ref 0.60–1.35)
Glucose, Bld: 78 mg/dL (ref 65–99)
Potassium: 4 mmol/L (ref 3.5–5.3)
Sodium: 141 mmol/L (ref 135–146)

## 2017-08-18 LAB — TSH: TSH: 1.94 mIU/L (ref 0.40–4.50)

## 2017-08-18 NOTE — Progress Notes (Signed)
Pre visit review using our clinic review tool, if applicable. No additional management support is needed unless otherwise documented below in the visit note. 

## 2017-08-18 NOTE — Patient Instructions (Signed)
GO TO THE LAB : Get the blood work     GO TO THE FRONT DESK Schedule your next appointment for a routine checkup in 8 months   Check the  blood pressure   monthly   Be sure your blood pressure is between 110/65 and  135/85. If it is consistently higher or lower, let me know
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
## 2017-08-18 NOTE — Assessment & Plan Note (Signed)
-  Td 2010;  Pt takes immunosuppressants:prevnar 11/2016, pnm 23 today 08/18/2017 (w/ pnm 23 booster in 5 years) -Recommend a flu shot a year -Labs reviewed.  Due for BMP and TSH -Diet and exercise discussed, recommend to see the bariatric office, information provided

## 2017-08-20 NOTE — Assessment & Plan Note (Signed)
Hyperglycemia: Last A1c 5.9.  Doing great HTN: Since the last visit, he is back on losartan.  Also taking metoprolol.  BP seems well controlled, no change.  Checking labs Ulcerative colitis: Follow-up by GI, currently asymptomatic RTC 8 months

## 2017-08-31 ENCOUNTER — Other Ambulatory Visit: Payer: Self-pay | Admitting: Gastroenterology

## 2017-08-31 MED FILL — LOSARTAN POTASSIUM 50 MG TA: 50 | 30 days supply | Qty: 30 | Fill #1

## 2017-08-31 MED FILL — METOPROLOL TARTRATE 50 MG T: 50 | 30 days supply | Qty: 90 | Fill #1

## 2017-08-31 MED FILL — azaTHIOprine 50 MG TABS: 50 | 30 days supply | Qty: 120 | Fill #1

## 2017-08-31 MED FILL — LIALDA 1.2 GM TABLET SA: 1.2 | 30 days supply | Qty: 120 | Fill #0

## 2017-09-27 MED FILL — LOSARTAN POTASSIUM 50 MG TA: 50 | 30 days supply | Qty: 30 | Fill #2

## 2017-09-27 MED FILL — azaTHIOprine 50 MG TABS: 50 | 30 days supply | Qty: 120 | Fill #2

## 2017-09-27 MED FILL — LIALDA 1.2 GM TABLET SA: 1.2 | 30 days supply | Qty: 120 | Fill #1

## 2017-09-27 MED FILL — METOPROLOL TARTRATE 50 MG T: 50 | 30 days supply | Qty: 90 | Fill #2

## 2017-10-31 ENCOUNTER — Telehealth: Payer: Self-pay

## 2017-10-31 MED ORDER — PREDNISONE 20 MG PO TABS: 40.0000 mg | ORAL_TABLET | Freq: Every day | ORAL | 2 refills | Status: AC

## 2017-10-31 MED FILL — predniSONE 20 MG TABS: 20 | 30 days supply | Qty: 60 | Fill #0

## 2017-10-31 NOTE — Telephone Encounter (Signed)
The pt has been advised and is not currently running fever.  Prescription has bene sent to the pharmacy.  He will keep appt with Dr Ardis Hughs and will go to the ED if symptoms worsen.

## 2017-10-31 NOTE — Telephone Encounter (Signed)
It looks like he has been on prednisone in the past. Start prednisone 40 mg daily and have him keep his follow-up appointment with Dr. Ardis Hughs next week. Sometime prior to that appointment have him come in for CBC, CRP, and comprehensive metabolic panel. Make sure he is not having significant fevers. Thank you

## 2017-10-31 NOTE — Telephone Encounter (Signed)
The pt is complaining of abd pain and cramping and he is passing blood independent of stool.  Has been taking lialda 4.8g daily as well as azathioprine 200 mg daily.  He is having very frequent, urgent feelings of having to move his bowels but passing only BRB.  Has a history of UC .  Has an appt with Dr Ardis Hughs on 11/07/17.  Should he begin prednisone until that appt?  Dr Henrene Pastor can you please review?  You are doc of the day.  Thank you

## 2017-11-02 ENCOUNTER — Other Ambulatory Visit: Payer: Self-pay | Admitting: Gastroenterology

## 2017-11-02 ENCOUNTER — Other Ambulatory Visit: Payer: Self-pay | Admitting: Internal Medicine

## 2017-11-02 MED FILL — METOPROLOL TARTRATE 50 MG T: 50 | 30 days supply | Qty: 90 | Fill #0

## 2017-11-02 MED FILL — azaTHIOprine 50 MG TABS: 50 | 30 days supply | Qty: 120 | Fill #3

## 2017-11-02 MED FILL — LOSARTAN POTASSIUM 50 MG TA: 50 | 90 days supply | Qty: 90 | Fill #0

## 2017-11-02 MED FILL — LIALDA 1.2 GM TABLET SA: 1.2 | 30 days supply | Qty: 120 | Fill #0

## 2017-11-07 ENCOUNTER — Encounter: Payer: Self-pay | Admitting: Gastroenterology

## 2017-11-07 ENCOUNTER — Ambulatory Visit: Payer: 59 | Admitting: Gastroenterology

## 2017-11-07 VITALS — BP 136/92 | HR 68 | Ht 66.5 in | Wt 231.1 lb

## 2017-11-07 DIAGNOSIS — K51911 Ulcerative colitis, unspecified with rectal bleeding: Secondary | ICD-10-CM | POA: Diagnosis not present
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
## 2017-11-07 NOTE — Patient Instructions (Addendum)
Consider entyvio.  You will have labs checked today in the basement lab.  Please head down after you check out with the front desk  (cbc, cmet,esr, quant gold tb test, stool for c. Diff by toxin  Decrease prednisone by 10mg every week starting in one week.  Please return to see Dr.  in 2 months. Follow up on 01/08/18 at 11am  Normal BMI (Body Mass Index- based on height and weight) is between 19 and 25. Your BMI today is Body mass index is 36.75 kg/m. . Please consider follow up  regarding your BMI with your Primary Care Provider.    

## 2017-11-28 ENCOUNTER — Ambulatory Visit: Payer: Managed Care, Other (non HMO) | Admitting: Internal Medicine

## 2017-12-04 ENCOUNTER — Telehealth: Payer: Self-pay | Admitting: Gastroenterology

## 2017-12-04 DIAGNOSIS — K51911 Ulcerative colitis, unspecified with rectal bleeding: Secondary | ICD-10-CM

## 2017-12-04 DIAGNOSIS — Z23 Encounter for immunization: Secondary | ICD-10-CM

## 2017-12-04 NOTE — Telephone Encounter (Signed)
Patient states he has thought it over and does want to go ahead and try medication entyvio. Patient ready for next steps.

## 2017-12-04 NOTE — Telephone Encounter (Signed)
FYI Dr Ardis Hughs the pt states he wants to go ahead and start entyvio. He will come in this week for the labs requested at previous office visit.

## 2017-12-05 ENCOUNTER — Other Ambulatory Visit: Payer: Self-pay

## 2017-12-05 DIAGNOSIS — K51911 Ulcerative colitis, unspecified with rectal bleeding: Secondary | ICD-10-CM

## 2017-12-05 NOTE — Progress Notes (Signed)
Media Information       Document Information   Insurance Card  [facility] / 08-18-2017/ JMS  08/18/2017 15:45  Attached To:  Yvette Rack  Source Information   Rosalin Hawking  [facility]

## 2017-12-05 NOTE — Telephone Encounter (Signed)
Records faxed to [facility] at [place] fax [phone] extension [phone]

## 2017-12-05 NOTE — Telephone Encounter (Signed)
Okay, he needs to get the previously ordered lab work that he did not get.  This is CBC, complete metabolic profile, TB quant gold.  Also he needs hepatitis B surface antibody, hepatitis B surface antigen, HIV testing, hepatitis C antibody.  He needs to have flu shot, he needs to be immunized for pneumococcus with a Prevnar 13 shot now and then Pneumovax 23 in about 2 to 3 months from now.   Please start working on [employer] new start 300 mg IV infusion week 0, week 2, week 6 and then every 8 weeks after that.  I would like to see him in the office in 6 to 7 weeks.

## 2017-12-05 NOTE — Telephone Encounter (Signed)
I spoke with the pt and he will come in on Thursday for pneumo 13, and will also have labs.  He will be getting flu shot on Thursday as an employee for [employer] and will bring a copy of that as well.  He has a follow up on 10/21 with Dr Ardis Hughs and pneumo 23 appt for 11/18 at 9 am.  [name] precert sent to [facility].

## 2017-12-05 NOTE — Telephone Encounter (Signed)
[place], Amy Merian Capron, Marthenia Rolling, RN        I called [employer] No precert req. He's good to go!  You're welcome.  Amy

## 2017-12-06 MED FILL — LIALDA 1.2 GM TABLET SA: 1.2 | 30 days supply | Qty: 120 | Fill #1

## 2017-12-06 MED FILL — METOPROLOL TARTRATE 50 MG T: 50 | 30 days supply | Qty: 90 | Fill #1

## 2017-12-06 MED FILL — azaTHIOprine 50 MG TABS: 50 | 30 days supply | Qty: 120 | Fill #4

## 2017-12-07 ENCOUNTER — Other Ambulatory Visit (INDEPENDENT_AMBULATORY_CARE_PROVIDER_SITE_OTHER): Payer: 59

## 2017-12-07 ENCOUNTER — Ambulatory Visit (INDEPENDENT_AMBULATORY_CARE_PROVIDER_SITE_OTHER): Payer: 59 | Admitting: Gastroenterology

## 2017-12-07 DIAGNOSIS — K51911 Ulcerative colitis, unspecified with rectal bleeding: Secondary | ICD-10-CM

## 2017-12-07 DIAGNOSIS — Z23 Encounter for immunization: Secondary | ICD-10-CM

## 2017-12-07 LAB — CBC WITH DIFFERENTIAL/PLATELET
Basophils Absolute: 0 10*3/uL (ref 0.0–0.1)
Basophils Relative: 0.5 % (ref 0.0–3.0)
Eosinophils Absolute: 0.1 10*3/uL (ref 0.0–0.7)
Eosinophils Relative: 1.6 % (ref 0.0–5.0)
HCT: 41.5 % (ref 39.0–52.0)
Hemoglobin: 14.2 g/dL (ref 13.0–17.0)
Lymphocytes Relative: 17.5 % (ref 12.0–46.0)
Lymphs Abs: 1.2 10*3/uL (ref 0.7–4.0)
MCHC: 34.2 g/dL (ref 30.0–36.0)
MCV: 98.7 fl (ref 78.0–100.0)
Monocytes Absolute: 0.5 10*3/uL (ref 0.1–1.0)
Monocytes Relative: 7.3 % (ref 3.0–12.0)
Neutro Abs: 5 10*3/uL (ref 1.4–7.7)
Neutrophils Relative %: 73.1 % (ref 43.0–77.0)
Platelets: 324 10*3/uL (ref 150.0–400.0)
RBC: 4.2 Mil/uL — ABNORMAL LOW (ref 4.22–5.81)
RDW: 14.8 % (ref 11.5–15.5)
WBC: 6.9 10*3/uL (ref 4.0–10.5)

## 2017-12-07 LAB — COMPREHENSIVE METABOLIC PANEL
ALT: 10 U/L (ref 0–53)
AST: 12 U/L (ref 0–37)
Albumin: 4.3 g/dL (ref 3.5–5.2)
Alkaline Phosphatase: 72 U/L (ref 39–117)
BUN: 14 mg/dL (ref 6–23)
CO2: 29 mEq/L (ref 19–32)
Calcium: 9.2 mg/dL (ref 8.4–10.5)
Chloride: 105 mEq/L (ref 96–112)
Creatinine, Ser: 1.29 mg/dL (ref 0.40–1.50)
GFR: 78.26 mL/min (ref >60.00)
Glucose, Bld: 108 mg/dL — ABNORMAL HIGH (ref 70–99)
Potassium: 3.9 mEq/L (ref 3.5–5.1)
Sodium: 143 mEq/L (ref 135–145)
Total Bilirubin: 1.1 mg/dL (ref 0.2–1.2)
Total Protein: 7.8 g/dL (ref 6.0–8.3)

## 2017-12-11 ENCOUNTER — Other Ambulatory Visit: Payer: Self-pay

## 2017-12-11 MED ORDER — VANCOMYCIN HCL 125 MG PO CAPS: 125.0000 mg | ORAL_CAPSULE | Freq: Four times a day (QID) | ORAL | 0 refills | Status: AC

## 2017-12-11 MED FILL — VANCOMYCIN HCL 125 MG CAP: 125 | 14 days supply | Qty: 56 | Fill #0

## 2017-12-12 LAB — QUANTIFERON-TB GOLD PLUS
Mitogen-NIL: >10 IU/mL
NIL: 0.05 IU/mL
QuantiFERON-TB Gold Plus: NEGATIVE
TB1-NIL: 0.01 IU/mL
TB2-NIL: 0 IU/mL

## 2017-12-12 LAB — HEPATITIS B SURFACE ANTIGEN: Hepatitis B Surface Ag: NONREACTIVE

## 2017-12-12 LAB — HEPATITIS C ANTIBODY
Hepatitis C Ab: NONREACTIVE
SIGNAL TO CUT-OFF: 0.02 (ref <1.00)

## 2017-12-12 LAB — HEPATITIS B SURFACE ANTIBODY,QUALITATIVE: Hep B S Ab: NONREACTIVE

## 2017-12-12 LAB — CLOSTRIDIUM DIFFICILE TOXIN B, QUALITATIVE, REAL-TIME PCR: Toxigenic C. Difficile by PCR: DETECTED — AB

## 2017-12-12 LAB — HIV ANTIBODY (ROUTINE TESTING W REFLEX): HIV 1&2 Ab, 4th Generation: NONREACTIVE

## 2017-12-13 ENCOUNTER — Ambulatory Visit: Payer: 59 | Admitting: Gastroenterology

## 2017-12-13 ENCOUNTER — Telehealth: Payer: Self-pay | Admitting: Gastroenterology

## 2017-12-13 NOTE — Telephone Encounter (Signed)
The pt wanted to be sure he was taking [facility] correctly.  We discussed the directions and he verbalized understanding.  He also asked about FMLA for work due to diarrhea.  I advised him to have the paperwork sent to medical records and Dr Ardis Hughs would fill out as appropriate

## 2017-12-19 DIAGNOSIS — Z79899 Other long term (current) drug therapy: Secondary | ICD-10-CM | POA: Diagnosis not present

## 2017-12-19 DIAGNOSIS — K5190 Ulcerative colitis, unspecified, without complications: Secondary | ICD-10-CM | POA: Diagnosis not present

## 2018-01-04 ENCOUNTER — Other Ambulatory Visit: Payer: Self-pay | Admitting: Gastroenterology

## 2018-01-04 MED FILL — azaTHIOprine 50 MG TABS: 50 | 30 days supply | Qty: 120 | Fill #5

## 2018-01-04 MED FILL — LIALDA 1.2 GM TABLET SA: 1.2 | 30 days supply | Qty: 120 | Fill #0

## 2018-01-04 MED FILL — METOPROLOL TARTRATE 50 MG T: 50 | 30 days supply | Qty: 90 | Fill #2

## 2018-01-08 ENCOUNTER — Ambulatory Visit: Payer: 59 | Admitting: Gastroenterology

## 2018-01-08 ENCOUNTER — Telehealth: Payer: Self-pay

## 2018-01-08 ENCOUNTER — Encounter: Payer: Self-pay | Admitting: Gastroenterology

## 2018-01-08 VITALS — BP 132/86 | HR 80 | Ht 66.0 in | Wt 228.4 lb

## 2018-01-08 DIAGNOSIS — K51911 Ulcerative colitis, unspecified with rectal bleeding: Secondary | ICD-10-CM | POA: Diagnosis not present

## 2018-01-08 MED ORDER — PEG 3350-KCL-NA BICARB-NACL 420 G PO SOLR: 4000.0000 mL | ORAL | 0 refills | Status: DC

## 2018-01-08 MED FILL — PEG-3350 SOLUTION: 420 | 2 days supply | Qty: 4000 | Fill #0

## 2018-01-08 NOTE — Telephone Encounter (Signed)
-----   Message from Angie Fava, LPN sent at 27/87/1836 11:51 AM EDT ----- Regarding: new start Lawerance Cruel, please see AVS for 01/08/18. There are orders for a New start entyvio.  Thanks [employer]

## 2018-01-08 NOTE — Telephone Encounter (Signed)
Plan for new start entyvio 343m IV (next week is week 0, then again week 2, then again week 6 and then again every 8 weeks).

## 2018-01-08 NOTE — Telephone Encounter (Signed)
Referral sent to [facility]
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
## 2018-01-08 NOTE — Patient Instructions (Addendum)
Colonoscopy Thursday 10/24 ([facility]).  Plan for new start entyvio 39m IV (next week is week 0, then again week 2, then again week 6 and then again every 8 weeks).  Please return to see Dr. JArdis Hughsin Dr. JArdis Hughsin 2-3 months.  Thank you for entrusting me with your care and choosing [facility]  Dr JArdis Hughs

## 2018-01-09 ENCOUNTER — Encounter (HOSPITAL_COMMUNITY): Payer: Self-pay | Admitting: *Deleted

## 2018-01-10 ENCOUNTER — Other Ambulatory Visit: Payer: Self-pay

## 2018-01-10 ENCOUNTER — Encounter (HOSPITAL_COMMUNITY): Payer: Self-pay | Admitting: *Deleted

## 2018-01-10 IMAGING — CT CT ABD-PELV W/ CM
2 of 4 series · 16 of 46 positions shown, 18 images · IV contrast (ISOVUE 300)
Comparison: CT 04/08/2009

CLINICAL DATA: LT sided abd pain with bloody stools and ulcerative
colitis hx s/p being off prednisone for [AGE]mL 182WXU-C66
IOPAMIDOL (182WXU-C66) INJECTION 61%

EXAM:
CT ABDOMEN AND PELVIS WITH CONTRAST
TECHNIQUE: Multidetector CT imaging of the abdomen and pelvis was performed
using the standard protocol following bolus administration of
intravenous contrast.
CONTRAST:  100mL 182WXU-C66 IOPAMIDOL (182WXU-C66) INJECTION 61%

[Series 2: abd/ pelvis · axial · 0.90mm/px · z∈[+833,+1283]mm · 13 of 100 slices shown, 15 images]
[im 5/100  soft-tissue]
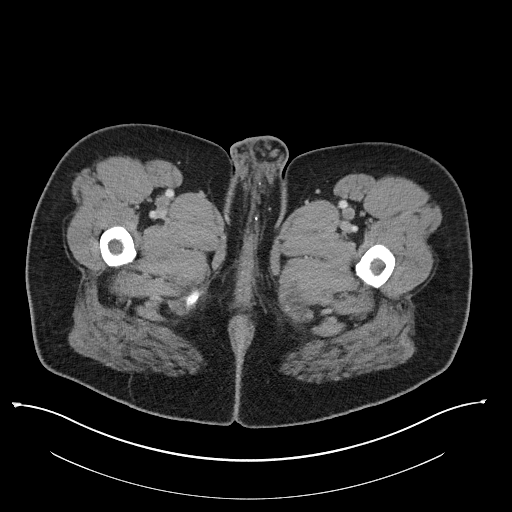
[im 5/100  bone]
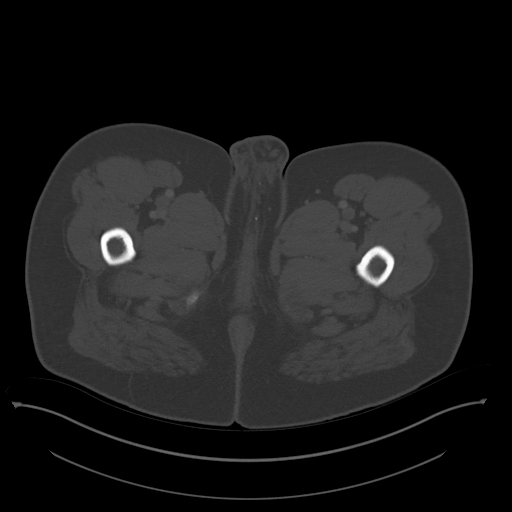
[im 14/100  soft-tissue]
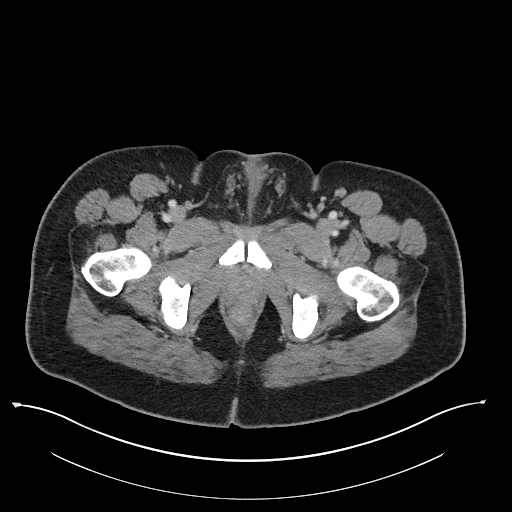
[im 23/100  soft-tissue]
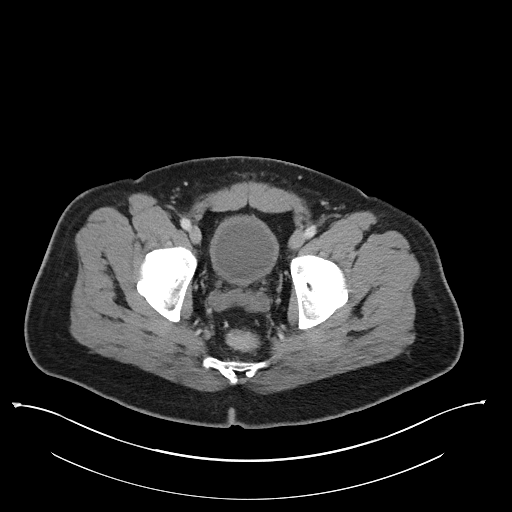
[im 28/100  soft-tissue]
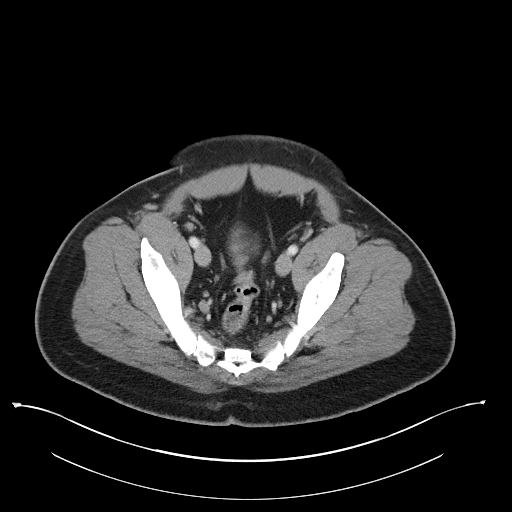
[im 37/100  soft-tissue]
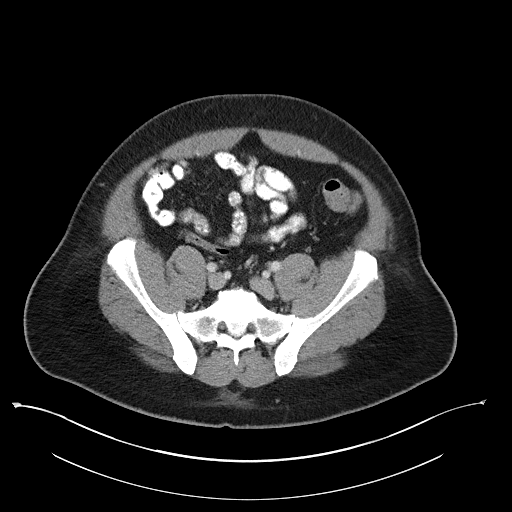
[im 41/100  soft-tissue]
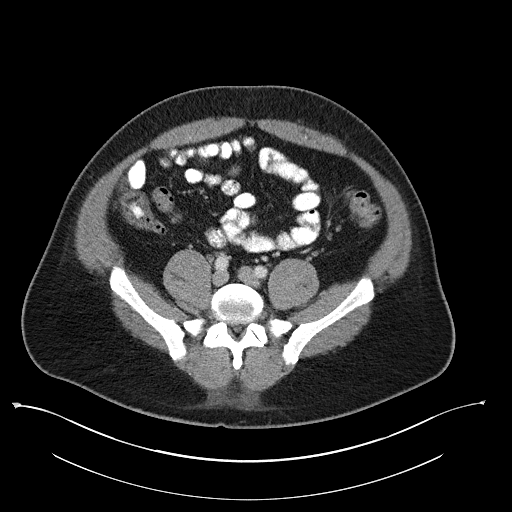
[im 50/100  soft-tissue]
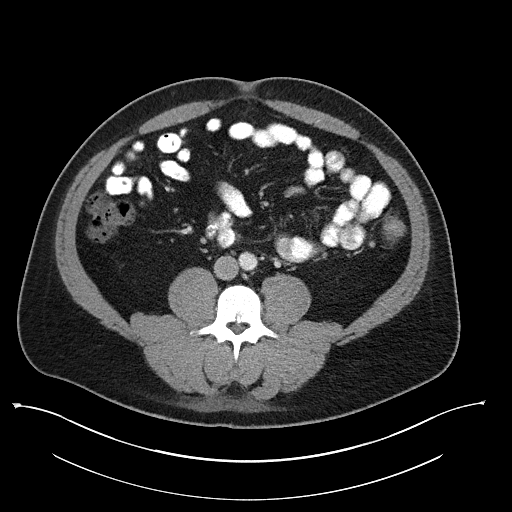
[im 59/100  soft-tissue]
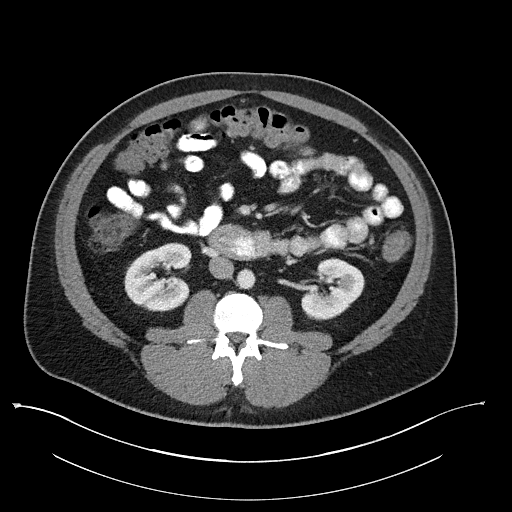
[im 64/100  soft-tissue]
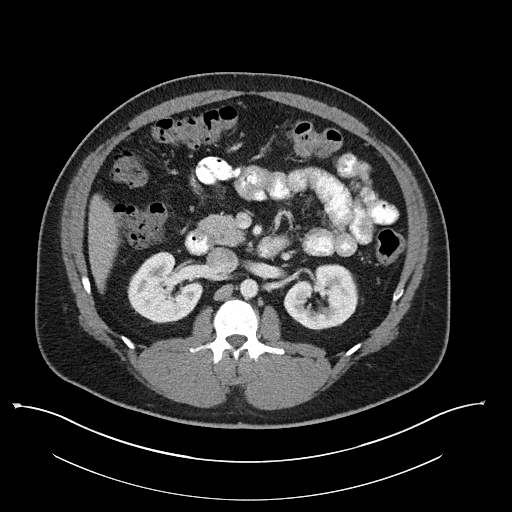
[im 64/100  bone]
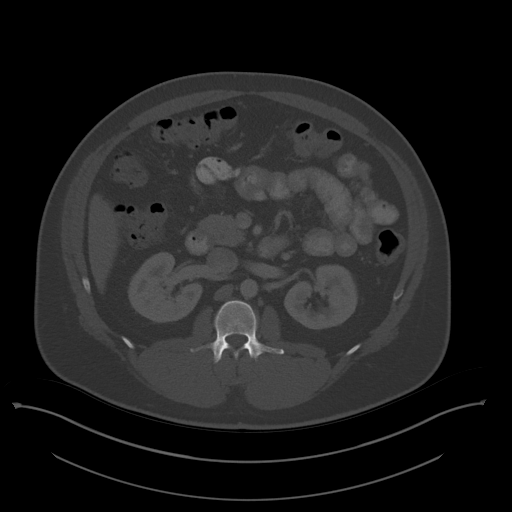
[im 73/100  soft-tissue]
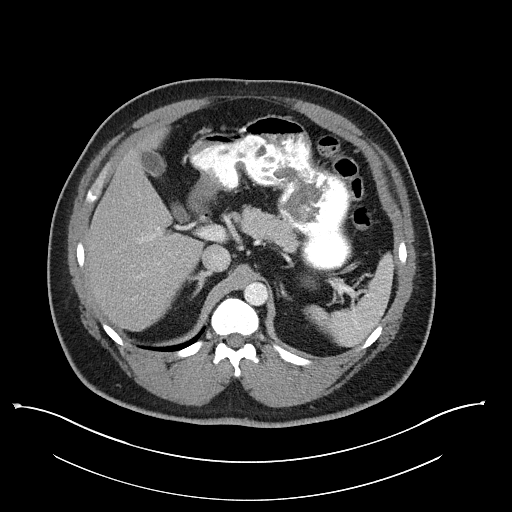
[im 77/100  soft-tissue]
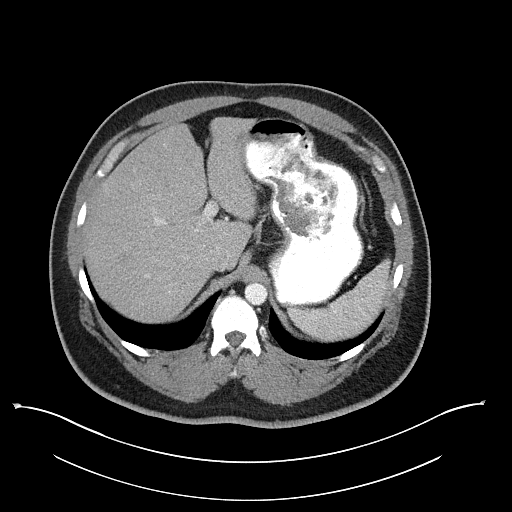
[im 86/100  soft-tissue]
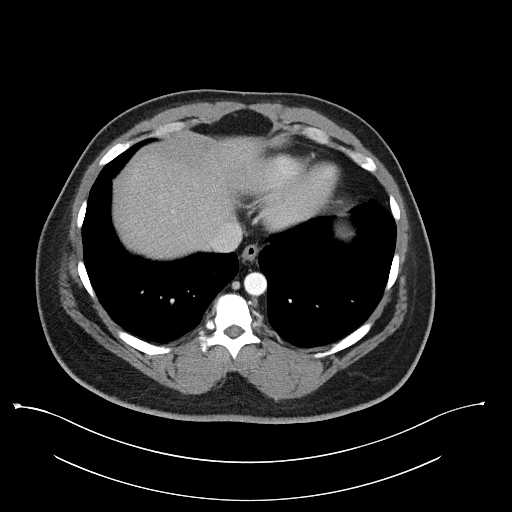
[im 95/100  soft-tissue]
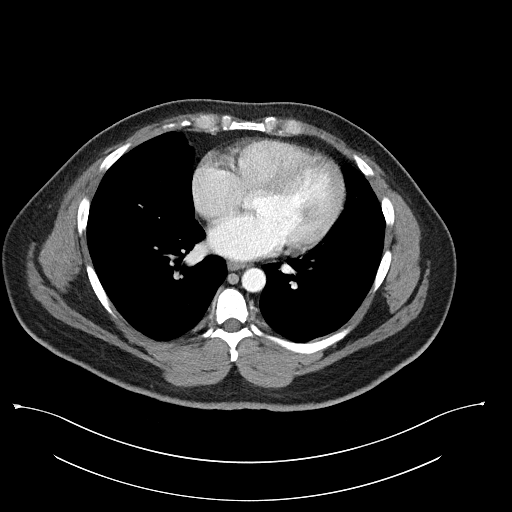

[Series 5: coronal soft tissue · coronal · 0.74mm/px · 3 of 106 slices shown]
[im 36/106  soft-tissue]
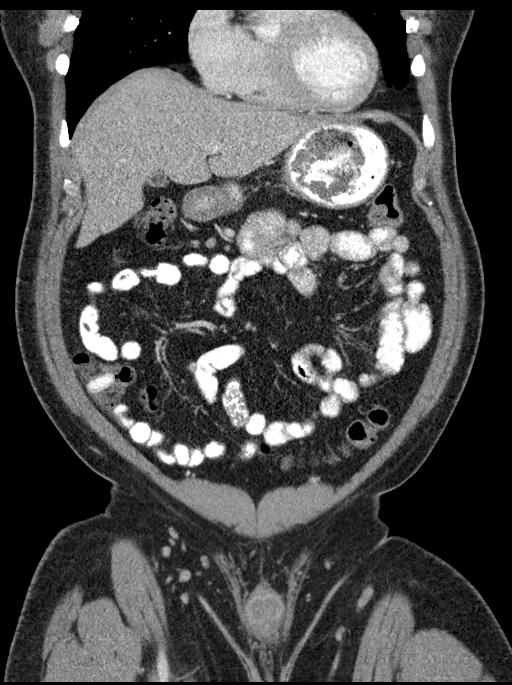
[im 47/106  soft-tissue]
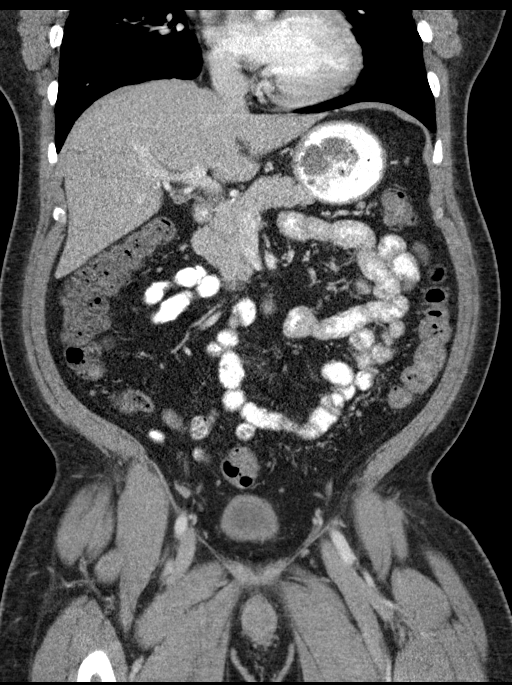
[im 59/106  soft-tissue]
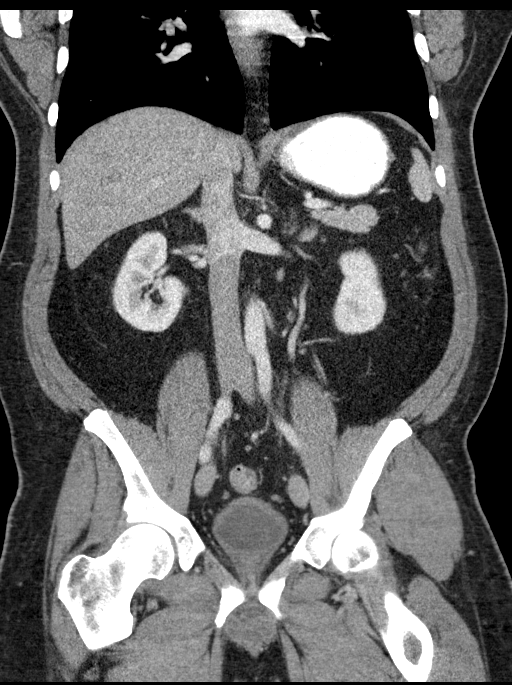

[16 of 46 positions shown; findings below may reference images not displayed]

FINDINGS: Lower chest: Lung bases are clear.

Hepatobiliary: 13 mm low-density lesion in the RIGHT hepatic lobe
unchanged from prior. Potential small hemangioma. No focal hepatic
lesion. No biliary duct dilatation. Common bile duct normal caliber.

Pancreas: Pancreas is normal. No ductal dilatation. No pancreatic
inflammation.

Spleen: Normal spleen

Adrenals/urinary tract: Adrenal glands and kidneys are normal. The
ureters and bladder normal.

Stomach/Bowel: Stomach, duodenum small bowel are normal. Appendix is
normal. Terminal ileum is normal. Ascending and transverse colon
normal. Descending colon rectosigmoid colon normal. No evidence of
bowel inflammation. There are small lymph nodes scattered within the
colonic mesenteries which are less than 1 cm.

Vascular/Lymphatic: Abdominal aorta is normal caliber. There is no
retroperitoneal or periportal lymphadenopathy. No pelvic
lymphadenopathy.

Reproductive: Prostate normal.

Other: No free fluid.

Musculoskeletal: No aggressive osseous lesion.
IMPRESSION: 1. No evidence of inflammation in the colon or small bowel.
2. Small lymph nodes scattered throughout the colonic mesentery are
benign / reactive.
3. No acute findings in the abdomen or to pelvis

## 2018-01-11 ENCOUNTER — Ambulatory Visit (HOSPITAL_COMMUNITY): Payer: 59 | Admitting: Anesthesiology

## 2018-01-11 ENCOUNTER — Ambulatory Visit (HOSPITAL_COMMUNITY)
Admission: RE | Admit: 2018-01-11 | Discharge: 2018-01-11 | Disposition: A | Discharge status: Home / Self Care | Payer: 59 | Source: Ambulatory Visit | Attending: Gastroenterology | Admitting: Gastroenterology

## 2018-01-11 ENCOUNTER — Encounter (HOSPITAL_COMMUNITY)
Admission: RE | Disposition: A | Discharge status: Home / Self Care | Payer: Self-pay | Source: Ambulatory Visit | Attending: Gastroenterology

## 2018-01-11 ENCOUNTER — Encounter (HOSPITAL_COMMUNITY): Payer: Self-pay | Admitting: Gastroenterology

## 2018-01-11 ENCOUNTER — Telehealth: Payer: Self-pay

## 2018-01-11 DIAGNOSIS — I10 Essential (primary) hypertension: Secondary | ICD-10-CM | POA: Insufficient documentation

## 2018-01-11 DIAGNOSIS — K5150 Left sided colitis without complications: Secondary | ICD-10-CM | POA: Diagnosis not present

## 2018-01-11 DIAGNOSIS — Z79899 Other long term (current) drug therapy: Secondary | ICD-10-CM | POA: Diagnosis not present

## 2018-01-11 DIAGNOSIS — Z800 Family history of malignant neoplasm of digestive organs: Secondary | ICD-10-CM | POA: Insufficient documentation

## 2018-01-11 DIAGNOSIS — K529 Noninfective gastroenteritis and colitis, unspecified: Secondary | ICD-10-CM | POA: Diagnosis not present

## 2018-01-11 DIAGNOSIS — K51911 Ulcerative colitis, unspecified with rectal bleeding: Secondary | ICD-10-CM

## 2018-01-11 HISTORY — PX: COLONOSCOPY WITH PROPOFOL: SHX5780

## 2018-01-11 HISTORY — PX: BIOPSY: SHX5522

## 2018-01-11 SURGERY — COLONOSCOPY WITH PROPOFOL
Anesthesia: Monitor Anesthesia Care

## 2018-01-11 MED ORDER — PROPOFOL 500 MG/50ML IV EMUL
INTRAVENOUS | Status: DC | PRN
  Administered 2018-01-11: 150 ug/kg/min via INTRAVENOUS

## 2018-01-11 MED ORDER — PROPOFOL 500 MG/50ML IV EMUL
INTRAVENOUS | Status: DC | PRN
  Administered 2018-01-11: 50 mg via INTRAVENOUS

## 2018-01-11 MED ORDER — ONDANSETRON HCL 4 MG/2ML IJ SOLN
INTRAMUSCULAR | Status: DC | PRN
  Administered 2018-01-11: 4 mg via INTRAVENOUS

## 2018-01-11 MED ORDER — MIDAZOLAM HCL 5 MG/5ML IJ SOLN
INTRAMUSCULAR | Status: DC | PRN
  Administered 2018-01-11: 2 mg via INTRAVENOUS

## 2018-01-11 MED ORDER — PROPOFOL 10 MG/ML IV BOLUS
INTRAVENOUS | Status: AC
  Filled 2018-01-11: qty 40

## 2018-01-11 MED ORDER — MIDAZOLAM HCL 2 MG/2ML IJ SOLN
INTRAMUSCULAR | Status: AC
  Filled 2018-01-11: qty 2

## 2018-01-11 MED ORDER — LACTATED RINGERS IV SOLN: INTRAVENOUS | Status: DC

## 2018-01-11 MED ORDER — SODIUM CHLORIDE 0.9 % IV SOLN: INTRAVENOUS | Status: DC

## 2018-01-11 SURGICAL SUPPLY — 21 items

## 2018-01-11 NOTE — Discharge Instructions (Signed)

## 2018-01-11 NOTE — Telephone Encounter (Signed)
Appt made for 03/02/18 845 am. Pt notified via My Chart

## 2018-01-11 NOTE — Anesthesia Postprocedure Evaluation (Signed)
Anesthesia Post Note  Patient: Corey Costa  Procedure(s) Performed: COLONOSCOPY WITH PROPOFOL (N/A ) BIOPSY     Patient location during evaluation: PACU Anesthesia Type: MAC Level of consciousness: awake and alert Pain management: pain level controlled Vital Signs Assessment: post-procedure vital signs reviewed and stable Respiratory status: spontaneous breathing, nonlabored ventilation, respiratory function stable and patient connected to nasal cannula oxygen Cardiovascular status: stable and blood pressure returned to baseline Postop Assessment: no apparent nausea or vomiting Anesthetic complications: no    Last Vitals:  Vitals:   01/11/18 0930 01/11/18 0940  BP: 119/69 (!) 132/104  Pulse: 72 73  Resp: 16 16  Temp:    SpO2: 100% 100%    Last Pain:  Vitals:   01/11/18 0940  TempSrc:   PainSc: 0-No pain                 Romi Rathel COKER

## 2018-01-11 NOTE — Telephone Encounter (Signed)
-----   Message from Milus Banister, MD sent at 01/11/2018  9:35 AM EDT ----- Just completed his colonoscopy.  Please continue with plans for entyvio new start next week.  rov with me in 6-8 weeks.  Thanks

## 2018-01-11 NOTE — Transfer of Care (Signed)
Immediate Anesthesia Transfer of Care Note  Patient: Corey Costa  Procedure(s) Performed: Procedure(s): COLONOSCOPY WITH PROPOFOL (N/A) BIOPSY  Patient Location: PACU  Anesthesia Type:MAC  Level of Consciousness:  sedated, patient cooperative and responds to stimulation  Airway & Oxygen Therapy:Patient Spontanous Breathing and Patient connected to face mask oxgen  Post-op Assessment:  Report given to PACU RN and Post -op Vital signs reviewed and stable  Post vital signs:  Reviewed and stable  Last Vitals:  Vitals:   01/11/18 0835  BP: (!) 165/99  Pulse: 74  Resp: 12  Temp: 37.2 C  SpO2: 191%    Complications: No apparent anesthesia complications

## 2018-01-11 NOTE — Anesthesia Preprocedure Evaluation (Signed)
Anesthesia Evaluation  Patient identified by MRN, date of birth, ID band Patient awake    Reviewed: Allergy & Precautions, NPO status , Patient's Chart, lab work & pertinent test results  Airway Mallampati: II  TM Distance: >3 FB Neck ROM: Full    Dental  (+) Teeth Intact, Dental Advisory Given   Pulmonary    breath sounds clear to auscultation       Cardiovascular hypertension,  Rhythm:Regular Rate:Normal     Neuro/Psych    GI/Hepatic   Endo/Other    Renal/GU      Musculoskeletal   Abdominal   Peds  Hematology   Anesthesia Other Findings   Reproductive/Obstetrics                             Anesthesia Physical Anesthesia Plan  ASA: II  Anesthesia Plan: MAC   Post-op Pain Management:    Induction: Intravenous  PONV Risk Score and Plan: Ondansetron and Propofol infusion  Airway Management Planned: Natural Airway and Simple Face Mask  Additional Equipment:   Intra-op Plan:   Post-operative Plan:   Informed Consent: I have reviewed the patients History and Physical, chart, labs and discussed the procedure including the risks, benefits and alternatives for the proposed anesthesia with the patient or authorized representative who has indicated his/her understanding and acceptance.     Plan Discussed with: CRNA and Anesthesiologist  Anesthesia Plan Comments:         Anesthesia Quick Evaluation

## 2018-01-11 NOTE — Op Note (Signed)
[facility] Patient Name: Corey Costa Procedure Date: 01/11/2018 MRN: [id number] Attending MD: Milus Banister , MD Date of Birth: 20-Oct-1975 CSN: [id number] Age: 42 Admit Type: Outpatient Procedure:                Colonoscopy Indications:              Disease activity assessment of left-sided chronic                            ulcerative colitis Providers:                Milus Banister, MD, Cleda Daub, RN, Cletis Athens, Technician, Arnoldo Hooker, CRNA Referring MD:              Medicines:                Monitored Anesthesia Care Complications:            No immediate complications. Estimated blood loss:                            None. Estimated Blood Loss:     Estimated blood loss: none. Procedure:                Pre-Anesthesia Assessment:                           - Prior to the procedure, a History and Physical                            was performed, and patient medications and                            allergies were reviewed. The patient's tolerance of                            previous anesthesia was also reviewed. The risks                            and benefits of the procedure and the sedation                            options and risks were discussed with the patient.                            All questions were answered, and informed consent                            was obtained. Prior Anticoagulants: The patient has                            taken no previous anticoagulant or antiplatelet                            agents. ASA Grade Assessment:  II - A patient with                            mild systemic disease. After reviewing the risks                            and benefits, the patient was deemed in                            satisfactory condition to undergo the procedure.                           After obtaining informed consent, the colonoscope                            was passed under direct vision.  Throughout the                            procedure, the patient's blood pressure, pulse, and                            oxygen saturations were monitored continuously. The                            CF-HQ190L ([id number]) Olympus Adult Colonoscope was                            introduced through the anus and advanced to the the                            terminal ileum. The colonoscopy was performed                            without difficulty. The patient tolerated the                            procedure well. The quality of the bowel                            preparation was good. The terminal ileum, ileocecal                            valve, appendiceal orifice, and rectum were                            photographed. Scope In: 9:06:00 AM Scope Out: 9:19:26 AM Scope Withdrawal Time: 0 hours 10 minutes 4 seconds  Total Procedure Duration: 0 hours 13 minutes 26 seconds  Findings:      The terminal ileum appeared normal.      There was moderate to severe inflammation from the rectum to about       hepatic flexure with a very INdistinct transition to normal mucosa. The       inflammation was clearly more severe distally than it was in the more  proximal colon. Biopsies were taken from normal appearing mucosa (labled       'right colon') and diseased appearing colon (labled 'distal colon').      The exam was otherwise without abnormality on direct and retroflexion       views. Impression:               - There was moderate to severe inflammation from                            the rectum to about hepatic flexure with a very                            INdistinct transition to normal mucosa. The                            inflammation was clearly more severe distally than                            it was in the more proximal colon. Biopsies were                            taken from normal appearing mucosa (labled 'right                            colon') and diseased appearing  colon (labled                            'distal colon').                           - The examination was otherwise normal on direct                            and retroflexion views (including normal terminal                            ileum). Moderate Sedation:      N/A- Per Anesthesia Care Recommendation:           - Patient has a contact number available for                            emergencies. The signs and symptoms of potential                            delayed complications were discussed with the                            patient. Return to normal activities tomorrow.                            Written discharge instructions were provided to the                            patient.                           -  Resume previous diet.                           - Continue present medications.                           - Await pathology results.                           - Dr. Ardis Hughs' office will contact you about new                            start entyvio and also follow up office visit in                            6-8 weeks. Continue your other UC meds for now,                            will plan to start tapering them at your next                            office visit depending on how you respond to the                            entyvio. Procedure Code(s):        --- Professional ---                           [phone], Colonoscopy, flexible; with biopsy, single                            or multiple Diagnosis Code(s):        --- Professional ---                           K52.9, Noninfective gastroenteritis and colitis,                            unspecified                           K51.50, Left sided colitis without complications CPT copyright 2018 American Medical Association. All rights reserved. The codes documented in this report are preliminary and upon coder review may  be revised to meet current compliance requirements. Milus Banister, MD 01/11/2018 9:42:19  AM This report has been signed electronically. Number of Addenda: 0

## 2018-01-11 NOTE — Interval H&P Note (Signed)
History and Physical Interval Note:  01/11/2018 8:22 AM  Corey Costa  has presented today for surgery, with the diagnosis of uclerative colitis  The various methods of treatment have been discussed with the patient and family. After consideration of risks, benefits and other options for treatment, the patient has consented to  Procedure(s): COLONOSCOPY WITH PROPOFOL (N/A) as a surgical intervention .  The patient's history has been reviewed, patient examined, no change in status, stable for surgery.  I have reviewed the patient's chart and labs.  Questions were answered to the patient's satisfaction.     Milus Banister

## 2018-01-15 ENCOUNTER — Telehealth: Payer: Self-pay | Admitting: Gastroenterology

## 2018-01-15 NOTE — Telephone Encounter (Signed)
Pt called to inform that his pharmacy is faxing PA form for his entyvio.

## 2018-01-15 NOTE — Telephone Encounter (Signed)
Amy Mason Jim is working on [employer] today for the [employer]

## 2018-01-16 ENCOUNTER — Telehealth: Payer: Self-pay

## 2018-01-16 ENCOUNTER — Other Ambulatory Visit: Payer: Self-pay

## 2018-01-16 DIAGNOSIS — K51911 Ulcerative colitis, unspecified with rectal bleeding: Secondary | ICD-10-CM

## 2018-01-16 MED ORDER — ADALIMUMAB 80 MG/0.8ML & 40MG/0.4ML ~~LOC~~ PNKT: 160.0000 mg | PEN_INJECTOR | Freq: Once | SUBCUTANEOUS | 3 refills | Status: DC

## 2018-01-16 NOTE — Telephone Encounter (Signed)
The pt has been notified that Humira is the next option and information sent to [facility] .

## 2018-01-16 NOTE — Telephone Encounter (Signed)
Pt called for the information.

## 2018-01-16 NOTE — Telephone Encounter (Signed)
Ok, please let him know what his insurance company has said.    Lets shoot for humira new start.  129m [place] day 1, then 831msc two weeks later, then 4018mq two weeks later and every two weeks from then on.   Thanks

## 2018-01-16 NOTE — Telephone Encounter (Signed)
Dr Ardis Hughs I received a call from [facility] and was advised the pt insurance will not cover Entyvio until the pt has tried and failed [place] [facility] Sheldon.  Please advise

## 2018-01-23 ENCOUNTER — Telehealth: Payer: Self-pay | Admitting: Gastroenterology

## 2018-01-23 NOTE — Telephone Encounter (Signed)
Left message on machine to call back  

## 2018-01-23 NOTE — Telephone Encounter (Signed)
The pt has been advised that his prescription has been sent to [facility].  He will call if he has not heard from them in 1 week.

## 2018-01-26 ENCOUNTER — Telehealth: Payer: Self-pay | Admitting: Pharmacist

## 2018-01-26 NOTE — Telephone Encounter (Signed)
Called patient and he is interested in video visit for [facility] for his Humira. Will send secure email with video visit instructions.

## 2018-01-30 ENCOUNTER — Other Ambulatory Visit: Payer: Self-pay | Admitting: Pharmacist

## 2018-01-30 ENCOUNTER — Ambulatory Visit (INDEPENDENT_AMBULATORY_CARE_PROVIDER_SITE_OTHER): Payer: 59 | Admitting: Pharmacist

## 2018-01-30 DIAGNOSIS — Z79899 Other long term (current) drug therapy: Secondary | ICD-10-CM

## 2018-01-30 MED ORDER — ADALIMUMAB 80 MG/0.8ML & 40MG/0.4ML ~~LOC~~ PNKT: 160.0000 mg | PEN_INJECTOR | Freq: Once | SUBCUTANEOUS | 0 refills | Status: DC

## 2018-01-30 MED ORDER — ADALIMUMAB 80 MG/0.8ML ~~LOC~~ AJKT: 160.0000 mg | AUTO-INJECTOR | Freq: Once | SUBCUTANEOUS | 0 refills | Status: DC

## 2018-01-30 MED FILL — HUMIRA PEN-CD/UC/HS STARTER: 80 | 15 days supply | Qty: 3 | Fill #0

## 2018-01-30 NOTE — Progress Notes (Signed)
   S: Patient presents to [facility] for review of their specialty medication therapy.  Patient is currently taking Humira for ulcerative colitis. Patient is managed by Dr. Ardis Hughs for this.   Adherence: has not started yet  Efficacy: has not started yet  Dosing:  Ulcerative colitis: SubQ (may continue aminosalicylates and/or corticosteroids; if necessary, azathioprine, or mercaptopurine may also be continued): Initial: 160 mg (given as four 40 mg injections on day 1 or given as two 40 mg injections per day over 2 consecutive days), then 80 mg 2 weeks later (day 15). Maintenance: 40 mg every other week beginning day 29. Note: Only continue maintenance dose in patients demonstrating clinical remission by 8 weeks (day 57) of therapy.   Dose adjustments: Renal: no dose adjustments (has not been studied) Hepatic: no dose adjustments (has not been studied)  Screening: TB test: negative Hepatitis: negative  Monitoring: S/sx of infection: denies CBC: see below S/sx of hypersensitivity: denies S/sx of malignancy: denies S/sx of heart failure: denies  O:     Lab Results  Component Value Date   WBC 6.9 12/07/2017   HGB 14.2 12/07/2017   HCT 41.5 12/07/2017   MCV 98.7 12/07/2017   PLT 324.0 12/07/2017      Chemistry      Component Value Date/Time   NA 143 12/07/2017 0913   K 3.9 12/07/2017 0913   CL 105 12/07/2017 0913   CO2 29 12/07/2017 0913   BUN 14 12/07/2017 0913   CREATININE 1.29 12/07/2017 0913   CREATININE 1.19 08/18/2017 1642      Component Value Date/Time   CALCIUM 9.2 12/07/2017 0913   ALKPHOS 72 12/07/2017 0913   AST 12 12/07/2017 0913   ALT 10 12/07/2017 0913   BILITOT 1.1 12/07/2017 0913       A/P: 1. Medication review: Patient currently on Humira for ulcerative colitis. Reviewed the medication with the patient, including the following: Humira is a TNF blocking agent indicated for ankylosing spondylitis, Crohn's disease, Hidradenitis  suppurativa, psoriatic arthritis, plaque psoriasis, ulcerative colitis, and uveitis. Patient educated on purpose, proper use and potential adverse effects of Humira. The most common adverse effects are infections, headache, and injection site reactions. There is the possibility of an increased risk of malignancy but it is not well understood if this increased risk is due to there medication or the disease state. There are rare cases of pancytopenia and aplastic anemia. No recommendations for any changes at this time.   Christella Hartigan, PharmD, BCPS, BCACP, CPP Clinical Pharmacist Practitioner  [phone]

## 2018-02-05 ENCOUNTER — Ambulatory Visit (INDEPENDENT_AMBULATORY_CARE_PROVIDER_SITE_OTHER): Payer: 59 | Admitting: Gastroenterology

## 2018-02-05 ENCOUNTER — Other Ambulatory Visit: Payer: Self-pay | Admitting: Internal Medicine

## 2018-02-05 DIAGNOSIS — Z23 Encounter for immunization: Secondary | ICD-10-CM

## 2018-02-05 MED FILL — LOSARTAN POTASSIUM 50 MG TA: 50 | 30 days supply | Qty: 30 | Fill #1

## 2018-02-05 MED FILL — azaTHIOprine 50 MG TABS: 50 | 30 days supply | Qty: 120 | Fill #6

## 2018-02-05 MED FILL — METOPROLOL TARTRATE 50 MG T: 50 | 30 days supply | Qty: 90 | Fill #0

## 2018-02-05 MED FILL — LIALDA 1.2 GM TABLET SA: 1.2 | 30 days supply | Qty: 120 | Fill #1

## 2018-02-22 ENCOUNTER — Other Ambulatory Visit: Payer: Self-pay

## 2018-02-22 ENCOUNTER — Telehealth: Payer: Self-pay | Admitting: Gastroenterology

## 2018-02-22 MED ORDER — ADALIMUMAB 40 MG/0.4ML ~~LOC~~ PSKT: 40.0000 mg | PREFILLED_SYRINGE | SUBCUTANEOUS | 6 refills | Status: DC

## 2018-02-22 MED ORDER — ADALIMUMAB 80 MG/0.8ML ~~LOC~~ AJKT: 160.0000 mg | AUTO-INJECTOR | Freq: Once | SUBCUTANEOUS | 3 refills | Status: DC

## 2018-02-22 NOTE — Telephone Encounter (Signed)
Refill sent to the pharmacy pt aware

## 2018-02-23 ENCOUNTER — Other Ambulatory Visit: Payer: Self-pay | Admitting: Pharmacist

## 2018-02-23 ENCOUNTER — Other Ambulatory Visit: Payer: Self-pay

## 2018-02-23 ENCOUNTER — Telehealth: Payer: Self-pay | Admitting: Gastroenterology

## 2018-02-23 MED ORDER — ADALIMUMAB 40 MG/0.4ML ~~LOC~~ AJKT: 40.0000 mg | AUTO-INJECTOR | SUBCUTANEOUS | 6 refills | Status: DC

## 2018-02-23 MED ORDER — ADALIMUMAB 40 MG/0.4ML ~~LOC~~ PSKT: 40.0000 mg | PREFILLED_SYRINGE | SUBCUTANEOUS | 6 refills | Status: DC

## 2018-02-23 MED FILL — HUMIRA PEN 40 MG/0.4ML PNKT: 40 | 28 days supply | Qty: 2 | Fill #0

## 2018-02-23 NOTE — Telephone Encounter (Signed)
I spoke with the pharmacy and the insurance company.  The insurance company requested that the pharmacy call them to discuss the "[place]"  I called back to the pharmacy and advised them of the message from the insurance

## 2018-02-23 NOTE — Telephone Encounter (Signed)
Shawn from [place] needs a call back regarding PA for humira that they received but when trying to run medication it did not work. Needs some clarification on that.

## 2018-03-02 ENCOUNTER — Ambulatory Visit: Payer: 59 | Admitting: Gastroenterology

## 2018-03-08 ENCOUNTER — Other Ambulatory Visit: Payer: Self-pay | Admitting: Gastroenterology

## 2018-03-08 MED ORDER — MESALAMINE 1.2 G PO TBEC: DELAYED_RELEASE_TABLET | ORAL | 1 refills | Status: DC

## 2018-03-08 MED FILL — METOPROLOL TARTRATE 50 MG T: 50 | 30 days supply | Qty: 90 | Fill #1

## 2018-03-08 MED FILL — LIALDA 1.2 GM TABLET SA: 1.2 | 30 days supply | Qty: 120 | Fill #0

## 2018-03-08 MED FILL — LOSARTAN POTASSIUM 50 MG TA: 50 | 30 days supply | Qty: 30 | Fill #2

## 2018-03-08 MED FILL — azaTHIOprine 50 MG TABS: 50 | 30 days supply | Qty: 120 | Fill #0

## 2018-03-22 MED FILL — HUMIRA PEN 40 MG/0.4ML PNKT: 40 | 28 days supply | Qty: 2 | Fill #1

## 2018-04-10 MED FILL — METOPROLOL TARTRATE 50 MG T: 50 | 30 days supply | Qty: 90 | Fill #2

## 2018-04-10 MED FILL — azaTHIOprine 50 MG TABS: 50 | 30 days supply | Qty: 120 | Fill #1

## 2018-04-10 MED FILL — LOSARTAN POTASSIUM 50 MG TA: 50 | 30 days supply | Qty: 30 | Fill #3

## 2018-04-10 MED FILL — LIALDA 1.2 GM TABLET SA: 1.2 | 30 days supply | Qty: 120 | Fill #1

## 2018-04-12 MED FILL — HUMIRA PEN 40 MG/0.4ML PNKT: 40 | 28 days supply | Qty: 2 | Fill #2

## 2018-04-17 ENCOUNTER — Encounter: Payer: Self-pay | Admitting: Gastroenterology

## 2018-04-17 ENCOUNTER — Other Ambulatory Visit (INDEPENDENT_AMBULATORY_CARE_PROVIDER_SITE_OTHER): Payer: 59

## 2018-04-17 ENCOUNTER — Ambulatory Visit: Payer: 59 | Admitting: Gastroenterology

## 2018-04-17 VITALS — BP 138/82 | HR 62 | Ht 65.0 in | Wt 230.0 lb

## 2018-04-17 DIAGNOSIS — K51911 Ulcerative colitis, unspecified with rectal bleeding: Secondary | ICD-10-CM | POA: Diagnosis not present

## 2018-04-17 LAB — COMPREHENSIVE METABOLIC PANEL
ALT: 12 U/L (ref 0–53)
AST: 14 U/L (ref 0–37)
Albumin: 4.4 g/dL (ref 3.5–5.2)
Alkaline Phosphatase: 68 U/L (ref 39–117)
BUN: 11 mg/dL (ref 6–23)
CO2: 30 mEq/L (ref 19–32)
Calcium: 9.3 mg/dL (ref 8.4–10.5)
Chloride: 104 mEq/L (ref 96–112)
Creatinine, Ser: 1.22 mg/dL (ref 0.40–1.50)
GFR: 78.39 mL/min (ref >60.00)
Glucose, Bld: 95 mg/dL (ref 70–99)
Potassium: 3.7 mEq/L (ref 3.5–5.1)
Sodium: 141 mEq/L (ref 135–145)
Total Bilirubin: 1.4 mg/dL — ABNORMAL HIGH (ref 0.2–1.2)
Total Protein: 7.6 g/dL (ref 6.0–8.3)

## 2018-04-17 LAB — CBC WITH DIFFERENTIAL/PLATELET
Basophils Absolute: 0 10*3/uL (ref 0.0–0.1)
Basophils Relative: 0.7 % (ref 0.0–3.0)
Eosinophils Absolute: 0.4 10*3/uL (ref 0.0–0.7)
Eosinophils Relative: 6.3 % — ABNORMAL HIGH (ref 0.0–5.0)
HCT: 45.8 % (ref 39.0–52.0)
Hemoglobin: 15.6 g/dL (ref 13.0–17.0)
Lymphocytes Relative: 24 % (ref 12.0–46.0)
Lymphs Abs: 1.4 10*3/uL (ref 0.7–4.0)
MCHC: 34.1 g/dL (ref 30.0–36.0)
MCV: 99.9 fl (ref 78.0–100.0)
Monocytes Absolute: 0.8 10*3/uL (ref 0.1–1.0)
Monocytes Relative: 13.7 % — ABNORMAL HIGH (ref 3.0–12.0)
Neutro Abs: 3.3 10*3/uL (ref 1.4–7.7)
Neutrophils Relative %: 55.3 % (ref 43.0–77.0)
Platelets: 342 10*3/uL (ref 150.0–400.0)
RBC: 4.59 Mil/uL (ref 4.22–5.81)
RDW: 14.6 % (ref 11.5–15.5)
WBC: 6 10*3/uL (ref 4.0–10.5)

## 2018-04-17 MED ORDER — AZATHIOPRINE 50 MG PO TABS: 50.0000 mg | ORAL_TABLET | Freq: Every day | ORAL | 6 refills | Status: DC

## 2018-04-17 NOTE — Patient Instructions (Addendum)
Stop lialda now.  Increase azathiaprine to 259m once daily.  You will have labs checked today in the basement lab.  Please head down after you check out with the front desk  (cbc, cmet).  Humira trough level and humira antibody check on Monday February 3rd.    Continue your humira every other week for now, on tuesdays.  Please return to see Dr. JArdis Hughsin 4 weeks  Thank you for entrusting me with your care and [facility]  Dr JArdis Hughs.
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
## 2018-04-20 ENCOUNTER — Encounter: Payer: Self-pay | Admitting: Internal Medicine

## 2018-04-20 ENCOUNTER — Ambulatory Visit: Payer: 59 | Admitting: Internal Medicine

## 2018-04-20 VITALS — BP 124/80 | HR 68 | Temp 98.4°F | Resp 16 | Ht 66.0 in | Wt 232.1 lb

## 2018-04-20 DIAGNOSIS — R739 Hyperglycemia, unspecified: Secondary | ICD-10-CM | POA: Diagnosis not present

## 2018-04-20 DIAGNOSIS — I10 Essential (primary) hypertension: Secondary | ICD-10-CM

## 2018-04-20 NOTE — Progress Notes (Signed)
Pre visit review using our clinic review tool, if applicable. No additional management support is needed unless otherwise documented below in the visit note. 
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
## 2018-04-20 NOTE — Patient Instructions (Signed)
   GO TO THE FRONT DESK Schedule your next appointment for a physical exam in 6 months   Tell the staff at the lab next week that I enter a order for a A1C

## 2018-04-21 NOTE — Assessment & Plan Note (Signed)
Hyperglycemia: Check A1c next week (he is having labs done at the request of GI) HTN: Continue losartan, metoprolol.  Seems well controlled, last CMP satisfactory Ulcerative colitis: Closely follow-up by GI. Preventive care: Last TD on record 2010, reports that he did have one last year @ work. RTC 6 months CPX

## 2018-04-23 ENCOUNTER — Other Ambulatory Visit (INDEPENDENT_AMBULATORY_CARE_PROVIDER_SITE_OTHER): Payer: 59

## 2018-04-23 ENCOUNTER — Other Ambulatory Visit: Payer: 59

## 2018-04-23 DIAGNOSIS — R739 Hyperglycemia, unspecified: Secondary | ICD-10-CM | POA: Diagnosis not present

## 2018-04-23 DIAGNOSIS — K51911 Ulcerative colitis, unspecified with rectal bleeding: Secondary | ICD-10-CM | POA: Diagnosis not present

## 2018-04-23 LAB — HEMOGLOBIN A1C: Hgb A1c MFr Bld: 5.7 % (ref 4.6–6.5)

## 2018-04-27 LAB — SERIAL MONITORING

## 2018-04-27 LAB — ADALIMUMAB+AB (SERIAL MONITOR)
Adalimumab Drug Level: 8.6 ug/mL
Anti-Adalimumab Antibody: <25 ng/mL

## 2018-04-30 LAB — ADALIMUMAB LEVEL AND ANTIBODY
Adalimumab Drug Level: 5.3 ug/mL
Anti-Adalimumab Antibody: <25 ng/mL

## 2018-05-01 ENCOUNTER — Telehealth: Payer: Self-pay | Admitting: Gastroenterology

## 2018-05-01 ENCOUNTER — Other Ambulatory Visit: Payer: Self-pay

## 2018-05-01 DIAGNOSIS — K51911 Ulcerative colitis, unspecified with rectal bleeding: Secondary | ICD-10-CM

## 2018-05-01 NOTE — Telephone Encounter (Signed)
Corey Banister, MD  Timothy Lasso, RN          Please call the patient. His Humira drug level should be adequate to help heal his IBD. He does not have any circulating Humira antibodies. I do not want to make any changes to the Humira dosing after seeing these results. Please remind him about the change we made to his azathioprine dosing, please confirm with him that he should be taking azathioprine 250 mg once daily. He has an appointment with me in 2 to 3 weeks. I would like him to have a CBC a day or 2 prior to that visit. Thank you

## 2018-05-01 NOTE — Telephone Encounter (Signed)
Notes recorded by Milus Banister, MD on 05/01/2018 at 8:36 AM EST  I am confused I am not sure why he had this Humira drug level checked twice. The 8.6 level looks good, the 5.3 level looks low. Can you please ask him when exactly these were done in relation to his Humira dosing. A true trough level should be taken 1 or 2 before his Humira injection. I am not sure which of these results is a true trough level.  Message left on the pt voice mail that I will send him a detailed message via My Chart with the information.

## 2018-05-01 NOTE — Telephone Encounter (Signed)
Pt returning your call he advised to leave a detailed VM on his vm because he is at work and we will be closed by the time he gets off.

## 2018-05-01 NOTE — Telephone Encounter (Signed)
Dr Ardis Hughs the most recent lab (5.3) was completed 1 day prior to his humira.  The lab drew the level by mistake early.  I made a note NOT to draw it until 2/3.  I will call and have them not charge for that one done early.  He is taking the recommended dose of azathioprine.  He will keep appt as planned and have labs a day prior.

## 2018-05-02 ENCOUNTER — Other Ambulatory Visit: Payer: Self-pay

## 2018-05-02 ENCOUNTER — Other Ambulatory Visit: Payer: Self-pay | Admitting: Pharmacist

## 2018-05-02 MED ORDER — ADALIMUMAB 40 MG/0.4ML ~~LOC~~ AJKT: 40.0000 mg | AUTO-INJECTOR | SUBCUTANEOUS | 6 refills | Status: DC

## 2018-05-02 NOTE — Telephone Encounter (Signed)
Okay, thank you for clarifying.  That actually changes my recommendations.  I do not think his Humira trough level of 5.3 is high enough to help mucosal healing.  I would like to change his Humira dosing to 40 mg once every week instead of every other week.  Can you please work with his insurance company and let me know of the progress.  He needs return office visit with me in about 4 to 6 weeks.  Hopefully he will have started once weekly Humira dosing at that time and my plan will be to recheck a trough level after a few doses.  Thank you

## 2018-05-02 NOTE — Telephone Encounter (Signed)
The pt was advised and ROV scheduled and prescription sent to [facility]

## 2018-05-04 MED FILL — METOPROLOL TARTRATE 50 MG T: 50 | 30 days supply | Qty: 90 | Fill #3

## 2018-05-04 MED FILL — LOSARTAN POTASSIUM 50 MG TA: 50 | 30 days supply | Qty: 30 | Fill #4

## 2018-05-07 ENCOUNTER — Telehealth: Payer: Self-pay | Admitting: Gastroenterology

## 2018-05-07 NOTE — Telephone Encounter (Signed)
Pt call in and stated that he was advised by his pharm. That he is needing a prior authorization for the increase of the humira

## 2018-05-07 NOTE — Telephone Encounter (Signed)
The pt was advised to have the pharmacy send [place] a prior auth.  He was advised to call with any further questions.

## 2018-05-08 MED FILL — HUMIRA PEN 40 MG/0.4ML PNKT: 40 | 28 days supply | Qty: 2 | Fill #3

## 2018-05-08 MED FILL — azaTHIOprine 50 MG TABS: 50 | 30 days supply | Qty: 150 | Fill #0

## 2018-05-15 ENCOUNTER — Telehealth: Payer: Self-pay | Admitting: Gastroenterology

## 2018-05-15 NOTE — Telephone Encounter (Signed)
The pt is aware I received the denial late yesterday and I will work on an appeal today.

## 2018-05-15 NOTE — Telephone Encounter (Signed)
Pt said that PA for humira was denied, he wants to know what the plan is since he needs a medication. Pls call him.

## 2018-05-16 ENCOUNTER — Ambulatory Visit: Payer: 59 | Admitting: Gastroenterology

## 2018-05-18 NOTE — Telephone Encounter (Signed)
Pt called advised that the humira med was denied again and would like to know what is the next plan.

## 2018-05-18 NOTE — Telephone Encounter (Signed)
I spoke with the insurance company and they are in the process of making a decision regarding the appeal. We will call as soon as we get a response.

## 2018-05-23 MED FILL — predniSONE 20 MG TABS: 20 | 30 days supply | Qty: 60 | Fill #1

## 2018-06-01 MED FILL — HUMIRA PEN 40 MG/0.4ML PNKT: 40 | 28 days supply | Qty: 2 | Fill #4

## 2018-06-04 ENCOUNTER — Other Ambulatory Visit: Payer: Self-pay | Admitting: Internal Medicine

## 2018-06-04 MED FILL — LOSARTAN POTASSIUM 50 MG TA: 50 | 30 days supply | Qty: 30 | Fill #0

## 2018-06-04 MED FILL — azaTHIOprine 50 MG TABS: 50 | 30 days supply | Qty: 150 | Fill #1

## 2018-06-04 MED FILL — METOPROLOL TARTRATE 50 MG T: 50 | 30 days supply | Qty: 90 | Fill #4

## 2018-06-08 ENCOUNTER — Ambulatory Visit: Payer: 59 | Admitting: Gastroenterology

## 2018-06-23 MED FILL — HUMIRA PEN 40 MG/0.4ML PNKT: 40 | 28 days supply | Qty: 2 | Fill #5

## 2018-07-03 MED FILL — azaTHIOprine 50 MG TABS: 50 | 30 days supply | Qty: 150 | Fill #2

## 2018-07-03 MED FILL — LOSARTAN POTASSIUM 50 MG TA: 50 | 30 days supply | Qty: 30 | Fill #1

## 2018-07-03 MED FILL — predniSONE 20 MG TABS: 20 | 30 days supply | Qty: 60 | Fill #2

## 2018-07-03 MED FILL — METOPROLOL TARTRATE 50 MG T: 50 | 30 days supply | Qty: 90 | Fill #5

## 2018-07-25 ENCOUNTER — Telehealth: Payer: Self-pay | Admitting: Gastroenterology

## 2018-07-25 MED ORDER — ADALIMUMAB 40 MG/0.4ML ~~LOC~~ AJKT: 40.0000 mg | AUTO-INJECTOR | SUBCUTANEOUS | 6 refills | Status: DC

## 2018-07-25 NOTE — Telephone Encounter (Signed)
I have sent the prior auth to [facility] and pt aware

## 2018-07-25 NOTE — Telephone Encounter (Signed)
Patient called he is needing a prior authorization for the humira pen

## 2018-07-27 ENCOUNTER — Telehealth: Payer: Self-pay

## 2018-07-27 NOTE — Telephone Encounter (Signed)
Dr Ardis Hughs the pt insurance company has denied humira every week dosing.  I filed an appeal and that was also denied.  Please advise

## 2018-07-30 MED FILL — HUMIRA PEN 40 MG/0.4ML PNKT: 40 | 28 days supply | Qty: 2 | Fill #6

## 2018-07-31 ENCOUNTER — Other Ambulatory Visit: Payer: Self-pay

## 2018-07-31 DIAGNOSIS — K51911 Ulcerative colitis, unspecified with rectal bleeding: Secondary | ICD-10-CM

## 2018-07-31 NOTE — Telephone Encounter (Signed)
Please let him know about his insurance companies decisions.  I will discuss this and options from here at his next office visit which I believe is 1 week from today.  He needs a CBC and complete metabolic profile later this week.  Can you please ask him to verify his azathioprine daily dose, i'd like to see if he is taking the correct amount (should be 266m daily)

## 2018-07-31 NOTE — Telephone Encounter (Signed)
Spoke with pt and and he is aware. Pt to come later this week for labs, orders in epic. Pt confirmed he is taking azathioprine 5 pills daily which is 237m daily.

## 2018-08-01 ENCOUNTER — Telehealth: Payer: Self-pay | Admitting: Gastroenterology

## 2018-08-01 NOTE — Telephone Encounter (Signed)
Pt called to advise that his pharmacy told him this morning that they have not received PA for humira that we sent.

## 2018-08-01 NOTE — Telephone Encounter (Signed)
Pt advised of the information in the last note per Rosanne Sack and Dr Ardis Hughs.  He has been scheduled for 5/19 to discuss further therapies.

## 2018-08-02 ENCOUNTER — Telehealth: Payer: Self-pay | Admitting: Gastroenterology

## 2018-08-02 NOTE — Telephone Encounter (Signed)
[facility] pharmacist Leavy Cella called to inform that Humira was denied for weekly dosing and wanted to know how MD wishes to proceed.

## 2018-08-02 NOTE — Telephone Encounter (Signed)
Patient has an upcoming appointment with Dr Ardis Hughs. He will discuss other options at that time.

## 2018-08-06 ENCOUNTER — Other Ambulatory Visit: Payer: Self-pay | Admitting: Internal Medicine

## 2018-08-06 MED FILL — azaTHIOprine 50 MG TABS: 50 | 30 days supply | Qty: 150 | Fill #3

## 2018-08-06 MED FILL — LOSARTAN POTASSIUM 50 MG TA: 50 | 30 days supply | Qty: 30 | Fill #2

## 2018-08-06 MED FILL — METOPROLOL TARTRATE 50 MG T: 50 | 30 days supply | Qty: 90 | Fill #0

## 2018-08-07 ENCOUNTER — Ambulatory Visit: Payer: 59 | Admitting: Gastroenterology

## 2018-08-09 ENCOUNTER — Other Ambulatory Visit: Payer: Self-pay | Admitting: Gastroenterology

## 2018-08-09 ENCOUNTER — Ambulatory Visit: Payer: 59 | Admitting: Gastroenterology

## 2018-08-09 ENCOUNTER — Encounter: Payer: Self-pay | Admitting: General Surgery

## 2018-08-10 ENCOUNTER — Other Ambulatory Visit: Payer: Self-pay | Admitting: Pharmacist

## 2018-08-10 MED ORDER — ADALIMUMAB 40 MG/0.4ML ~~LOC~~ AJKT: 40.0000 mg | AUTO-INJECTOR | SUBCUTANEOUS | 6 refills | Status: DC

## 2018-08-14 ENCOUNTER — Ambulatory Visit: Payer: 59 | Admitting: Gastroenterology

## 2018-08-15 NOTE — Telephone Encounter (Signed)
Janett Billow from [facility] states that they wrote an appeal letter to patients insurance regarding this medication. She is needing Dr. Ardis Hughs to sign off on this rx. P: [phone] F: [phone]

## 2018-08-21 ENCOUNTER — Encounter: Payer: Self-pay | Admitting: General Surgery

## 2018-08-21 NOTE — Telephone Encounter (Signed)
I spoke to [place] from [employer]. She is re faxing the appeal letter over for Dr Ardis Hughs to sign off on the rx.

## 2018-08-22 ENCOUNTER — Ambulatory Visit (INDEPENDENT_AMBULATORY_CARE_PROVIDER_SITE_OTHER): Payer: 59 | Admitting: Gastroenterology

## 2018-08-22 ENCOUNTER — Encounter: Payer: Self-pay | Admitting: Gastroenterology

## 2018-08-22 VITALS — Ht 66.0 in | Wt 232.0 lb

## 2018-08-22 DIAGNOSIS — K51911 Ulcerative colitis, unspecified with rectal bleeding: Secondary | ICD-10-CM
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
## 2018-08-22 NOTE — Patient Instructions (Addendum)
He will have labs checked, CBC and complete metabolic profile.  We will  arrange a phone call with his insurance company about his options (they declined to cover Entyvio and now have declined to cover increase dose of humira).  He needs a new prior authorization for his current Humira dosing and we will take care of that as soon as possible, he is due for his next biweekly Humira 40 mg dosing a week from yesterday.

## 2018-08-24 ENCOUNTER — Other Ambulatory Visit: Payer: Self-pay | Admitting: Pharmacist

## 2018-08-24 ENCOUNTER — Telehealth: Payer: Self-pay | Admitting: Gastroenterology

## 2018-08-24 ENCOUNTER — Other Ambulatory Visit (INDEPENDENT_AMBULATORY_CARE_PROVIDER_SITE_OTHER): Payer: 59

## 2018-08-24 DIAGNOSIS — K51911 Ulcerative colitis, unspecified with rectal bleeding: Secondary | ICD-10-CM

## 2018-08-24 LAB — COMPREHENSIVE METABOLIC PANEL
ALT: 44 U/L (ref 0–53)
AST: 24 U/L (ref 0–37)
Albumin: 4.2 g/dL (ref 3.5–5.2)
Alkaline Phosphatase: 58 U/L (ref 39–117)
BUN: 11 mg/dL (ref 6–23)
CO2: 29 mEq/L (ref 19–32)
Calcium: 8.8 mg/dL (ref 8.4–10.5)
Chloride: 105 mEq/L (ref 96–112)
Creatinine, Ser: 1.17 mg/dL (ref 0.40–1.50)
GFR: 82.14 mL/min (ref >60.00)
Glucose, Bld: 96 mg/dL (ref 70–99)
Potassium: 4 mEq/L (ref 3.5–5.1)
Sodium: 142 mEq/L (ref 135–145)
Total Bilirubin: 1.4 mg/dL — ABNORMAL HIGH (ref 0.2–1.2)
Total Protein: 7.4 g/dL (ref 6.0–8.3)

## 2018-08-24 LAB — CBC WITH DIFFERENTIAL/PLATELET
Basophils Absolute: 0 10*3/uL (ref 0.0–0.1)
Basophils Relative: 0.5 % (ref 0.0–3.0)
Eosinophils Absolute: 0.1 10*3/uL (ref 0.0–0.7)
Eosinophils Relative: 1 % (ref 0.0–5.0)
HCT: 45.4 % (ref 39.0–52.0)
Hemoglobin: 15.8 g/dL (ref 13.0–17.0)
Lymphocytes Relative: 22.1 % (ref 12.0–46.0)
Lymphs Abs: 1.3 10*3/uL (ref 0.7–4.0)
MCHC: 34.9 g/dL (ref 30.0–36.0)
MCV: 102.4 fl — ABNORMAL HIGH (ref 78.0–100.0)
Monocytes Absolute: 0.6 10*3/uL (ref 0.1–1.0)
Monocytes Relative: 10 % (ref 3.0–12.0)
Neutro Abs: 4 10*3/uL (ref 1.4–7.7)
Neutrophils Relative %: 66.4 % (ref 43.0–77.0)
Platelets: 312 10*3/uL (ref 150.0–400.0)
RBC: 4.43 Mil/uL (ref 4.22–5.81)
RDW: 14.4 % (ref 11.5–15.5)
WBC: 6.1 10*3/uL (ref 4.0–10.5)

## 2018-08-24 MED ORDER — ADALIMUMAB 40 MG/0.4ML ~~LOC~~ AJKT: 40.0000 mg | AUTO-INJECTOR | SUBCUTANEOUS | 6 refills | Status: DC

## 2018-08-24 NOTE — Telephone Encounter (Signed)
Patient states his Corey Costa is still not at [facility] outpatient pharmacy and he is suppose to have an injection this coming Tuesday. Pt wanting to know what to do.

## 2018-08-24 NOTE — Telephone Encounter (Signed)
Per the insurance after 30 min on the phone the pt should be able to pick his prescription up at [facility] outpatient pharmacy.  I have notified the pt and he will try to pick the humira up and call me with any further issues

## 2018-08-27 NOTE — Telephone Encounter (Signed)
CF Humira needed. Wrong formula was authorized by insurance. New PA sent in through [facility] for CF formula. Patient is aware of the delay.

## 2018-08-27 NOTE — Telephone Encounter (Signed)
Patient calling in stating that he went to pharmacy last Friday and was told that they haven't received rx. Best # [phone]

## 2018-08-29 MED FILL — HUMIRA PEN 40 MG/0.4ML PNKT: 40 | 28 days supply | Qty: 4 | Fill #0

## 2018-08-30 ENCOUNTER — Telehealth: Payer: Self-pay

## 2018-08-30 NOTE — Telephone Encounter (Signed)
Grace Bushy, LPN reports she received a verbal approval for Humira and the patient was notified.

## 2018-08-30 NOTE — Telephone Encounter (Signed)
I spoke to Maudie Mercury from pharmacy benefits, she received the appeal letter, reviewed notes and labs. She stated patient has been approved for weekly Humira 40 mg pen kit until 08/2019. Patient has been notified and picked up his new prescription.

## 2018-09-05 MED FILL — azaTHIOprine 50 MG TABS: 50 | 30 days supply | Qty: 150 | Fill #4

## 2018-09-19 MED FILL — HUMIRA PEN 40 MG/0.4ML PNKT: 40 | 28 days supply | Qty: 4 | Fill #1

## 2018-09-28 MED FILL — METOPROLOL TARTRATE 50 MG T: 50 | 30 days supply | Qty: 90 | Fill #1

## 2018-09-28 MED FILL — LOSARTAN POTASSIUM 50 MG TA: 50 | 30 days supply | Qty: 30 | Fill #3

## 2018-10-17 MED FILL — HUMIRA PEN 40 MG/0.4ML PNKT: 40 | 28 days supply | Qty: 4 | Fill #2

## 2018-10-19 ENCOUNTER — Ambulatory Visit (INDEPENDENT_AMBULATORY_CARE_PROVIDER_SITE_OTHER): Payer: 59 | Admitting: Internal Medicine

## 2018-10-19 ENCOUNTER — Encounter: Payer: Self-pay | Admitting: Internal Medicine

## 2018-10-19 ENCOUNTER — Other Ambulatory Visit: Payer: Self-pay

## 2018-10-19 VITALS — BP 145/90 | HR 65 | Temp 99.1°F | Resp 16 | Ht 66.0 in | Wt 234.4 lb

## 2018-10-19 DIAGNOSIS — I10 Essential (primary) hypertension: Secondary | ICD-10-CM

## 2018-10-19 DIAGNOSIS — Z09 Encounter for follow-up examination after completed treatment for conditions other than malignant neoplasm: Secondary | ICD-10-CM

## 2018-10-19 DIAGNOSIS — Z0000 Encounter for general adult medical examination without abnormal findings: Secondary | ICD-10-CM

## 2018-10-19 DIAGNOSIS — K51911 Ulcerative colitis, unspecified with rectal bleeding: Secondary | ICD-10-CM | POA: Diagnosis not present

## 2018-10-19 MED ORDER — LOSARTAN POTASSIUM 50 MG PO TABS: 100.0000 mg | ORAL_TABLET | Freq: Every day | ORAL | 0 refills | Status: DC

## 2018-10-19 NOTE — Assessment & Plan Note (Addendum)
-  Td 2010; Td at work ~ 04/2017 -prevnar 11/2016 - pnm 23 today 08/18/2017 (w/ pnm 23 booster in 5 years) -Recommend a flu shot a year -Labs reviewed.  We will get BMP and FLP next week -Diet and exercise discussed
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
## 2018-10-19 NOTE — Progress Notes (Signed)
Pre visit review using our clinic review tool, if applicable. No additional management support is needed unless otherwise documented below in the visit note. 

## 2018-10-19 NOTE — Patient Instructions (Signed)
  GO TO THE FRONT DESK Schedule a nurse visit in 6 days.  Don't ask for an early appointment, when you come be sure you take your morning medications. We will check your blood pressure and do blood work (BMP and FLP)    Schedule your next appointment for a routine visit in 6 months      Increase losartan 50 mg: 2 tablets daily Continue metoprolol  Check the  blood pressure daily BP GOAL is between 110/65 and  135/85. If it is consistently higher or lower, let me know Please keep a log and bring it next week  Call anytime if you have headache, chest pain, dizziness

## 2018-10-21 NOTE — Assessment & Plan Note (Signed)
HTN: Currently on losartan 25m and metoprolol 50 mg 3 in the morning and 1 in the evening. He has not taking his medication today, ambulatory BPs typically normal, BP today is elevated: I recheck manually: Left arm 180/110, right arm 145/90. Patient is asymptomatic Plan: Increase losartan to 100 mg, continue metoprolol, wife is a nurse, monitor BPs at home Nurse visit in 1 week: BP check in both arms, BMP and FLP. RTC 1 week nurse visit RTC routine checkup 6 months

## 2018-10-25 ENCOUNTER — Other Ambulatory Visit: Payer: Self-pay

## 2018-10-25 ENCOUNTER — Ambulatory Visit (INDEPENDENT_AMBULATORY_CARE_PROVIDER_SITE_OTHER): Payer: 59 | Admitting: Internal Medicine

## 2018-10-25 ENCOUNTER — Other Ambulatory Visit (INDEPENDENT_AMBULATORY_CARE_PROVIDER_SITE_OTHER): Payer: 59

## 2018-10-25 VITALS — BP 165/91 | HR 60

## 2018-10-25 DIAGNOSIS — I10 Essential (primary) hypertension: Secondary | ICD-10-CM

## 2018-10-25 DIAGNOSIS — Z0000 Encounter for general adult medical examination without abnormal findings: Secondary | ICD-10-CM | POA: Diagnosis not present

## 2018-10-25 LAB — BASIC METABOLIC PANEL
BUN: 11 mg/dL (ref 6–23)
CO2: 30 mEq/L (ref 19–32)
Calcium: 9.4 mg/dL (ref 8.4–10.5)
Chloride: 106 mEq/L (ref 96–112)
Creatinine, Ser: 1.23 mg/dL (ref 0.40–1.50)
GFR: 77.47 mL/min (ref >60.00)
Glucose, Bld: 95 mg/dL (ref 70–99)
Potassium: 4.2 mEq/L (ref 3.5–5.1)
Sodium: 142 mEq/L (ref 135–145)

## 2018-10-25 LAB — LIPID PANEL
Cholesterol: 164 mg/dL (ref 0–200)
HDL: 44.3 mg/dL (ref >39.00)
LDL Cholesterol: 109 mg/dL — ABNORMAL HIGH (ref 0–99)
NonHDL: 119.33
Total CHOL/HDL Ratio: 4
Triglycerides: 54 mg/dL (ref 0.0–149.0)
VLDL: 10.8 mg/dL (ref 0.0–40.0)

## 2018-10-25 MED ORDER — AMLODIPINE BESYLATE 5 MG PO TABS: 5.0000 mg | ORAL_TABLET | Freq: Every day | ORAL | 3 refills | Status: DC

## 2018-10-25 MED FILL — AMLODIPINE BESYLATE 5 MG TA: 5 | 30 days supply | Qty: 30 | Fill #0

## 2018-10-25 MED FILL — LOSARTAN POTASSIUM 50 MG TA: 50 | 30 days supply | Qty: 60 | Fill #0

## 2018-10-25 NOTE — Progress Notes (Signed)
Patient is here for blood pressure check per Dr. Larose Kells.  Patient is taking losartan and metoprolol.  Today at home it was  173/114 left arm  140/105 right arm  No missed doses.  Blood pressure today is:  Right arm: 165/107  pulse 64 Left arm: 165/91 pulse 60   BP continues elevated, similar readings in the arms..  Plan: Continue losartan, metoprolol, add amlodipine 5 mg.  Monitor BPs at home, call with readings in 3 weeks.   [name], MD   Patient notified of plan and new medication sent to pharmacy.

## 2018-10-29 MED FILL — azaTHIOprine 50 MG TABS: 50 | 30 days supply | Qty: 150 | Fill #5

## 2018-10-29 MED FILL — METOPROLOL TARTRATE 50 MG T: 50 | 30 days supply | Qty: 90 | Fill #2

## 2018-11-14 MED FILL — HUMIRA PEN 40 MG/0.4ML PNKT: 40 | 28 days supply | Qty: 4 | Fill #3

## 2018-11-23 ENCOUNTER — Other Ambulatory Visit: Payer: Self-pay | Admitting: Internal Medicine

## 2018-11-23 MED FILL — METOPROLOL TARTRATE 50 MG T: 50 | 60 days supply | Qty: 180 | Fill #3

## 2018-11-23 MED FILL — AZATHIOPRINE 50 MG TABS: 50 | 30 days supply | Qty: 150 | Fill #6

## 2018-11-23 MED FILL — LOSARTAN POTASSIUM 50 MG TA: 50 | 30 days supply | Qty: 30 | Fill #4

## 2018-11-23 MED FILL — AMLODIPINE BESYLATE 5 MG TA: 5 | 90 days supply | Qty: 90 | Fill #0

## 2018-12-12 MED FILL — HUMIRA PEN 40 MG/0.4ML PNKT: 40 | 28 days supply | Qty: 4 | Fill #4

## 2018-12-17 MED FILL — LOSARTAN POTASSIUM 50 MG TA: 50 | 30 days supply | Qty: 30 | Fill #5

## 2018-12-28 ENCOUNTER — Ambulatory Visit: Payer: 59

## 2018-12-29 MED FILL — LOSARTAN POTASSIUM 50 MG TA: 50 | 30 days supply | Qty: 60 | Fill #0

## 2019-01-04 ENCOUNTER — Other Ambulatory Visit: Payer: Self-pay | Admitting: Gastroenterology

## 2019-01-04 MED FILL — AZATHIOPRINE 50 MG TABS: 50 | 30 days supply | Qty: 150 | Fill #0

## 2019-01-08 ENCOUNTER — Other Ambulatory Visit: Payer: Self-pay | Admitting: Gastroenterology

## 2019-01-12 ENCOUNTER — Encounter: Payer: Self-pay | Admitting: Internal Medicine

## 2019-01-16 MED FILL — HUMIRA PEN 40 MG/0.4ML PNKT: 40 | 28 days supply | Qty: 4 | Fill #5

## 2019-01-30 ENCOUNTER — Ambulatory Visit: Payer: 59 | Attending: Family Medicine | Admitting: Pharmacist

## 2019-01-30 DIAGNOSIS — Z79899 Other long term (current) drug therapy: Secondary | ICD-10-CM

## 2019-01-30 NOTE — Progress Notes (Signed)
   S: Patient presents  for review of their specialty medication therapy.  Patient is currently taking Humira for ulcerative colitis. Patient is managed by Dr. Ardis Hughs for this.   Adherence: confirms  Efficacy: reports doing a lot better   Dosing:  Ulcerative colitis: 40 mg every week.  Dose adjustments: Renal: no dose adjustments (has not been studied) Hepatic: no dose adjustments (has not been studied)  Screening: TB test: negative Hepatitis: negative  Monitoring: S/sx of injection site reactions: denies  S/sx of infection: denies CBC: see below S/sx of hypersensitivity: denies S/sx of malignancy: denies S/sx of heart failure: denies  O:  Lab Results  Component Value Date   WBC 6.1 08/24/2018   HGB 15.8 08/24/2018   HCT 45.4 08/24/2018   MCV 102.4 (H) 08/24/2018   PLT 312.0 08/24/2018      Chemistry      Component Value Date/Time   NA 142 10/25/2018 1348   K 4.2 10/25/2018 1348   CL 106 10/25/2018 1348   CO2 30 10/25/2018 1348   BUN 11 10/25/2018 1348   CREATININE 1.23 10/25/2018 1348   CREATININE 1.19 08/18/2017 1642      Component Value Date/Time   CALCIUM 9.4 10/25/2018 1348   ALKPHOS 58 08/24/2018 0953   AST 24 08/24/2018 0953   ALT 44 08/24/2018 0953   BILITOT 1.4 (H) 08/24/2018 0953      A/P: 1. Medication review: Patient currently on Humira for ulcerative colitis. Reviewed the medication with the patient, including the following: Humira is a TNF blocking agent indicated for ankylosing spondylitis, Crohn's disease, Hidradenitis suppurativa, psoriatic arthritis, plaque psoriasis, ulcerative colitis, and uveitis. Patient educated on purpose, proper use and potential adverse effects of Humira. The most common adverse effects are infections, headache, and injection site reactions. There is the possibility of an increased risk of malignancy but it is not well understood if this increased risk is due to there medication or the disease state. There are  rare cases of pancytopenia and aplastic anemia. No recommendations for any changes at this time.   Benard Halsted, PharmD, [place] [phone]

## 2019-02-04 MED FILL — LOSARTAN POTASSIUM 50 MG TA: 50 | 30 days supply | Qty: 60 | Fill #1

## 2019-02-04 MED FILL — METOPROLOL TARTRATE 50 MG T: 50 | 30 days supply | Qty: 90 | Fill #4

## 2019-02-06 ENCOUNTER — Other Ambulatory Visit: Payer: Self-pay

## 2019-02-06 MED FILL — HUMIRA PEN 40 MG/0.4ML PNKT: 40 | 28 days supply | Qty: 4 | Fill #6

## 2019-02-21 MED FILL — AZATHIOPRINE 50 MG TABS: 50 | 30 days supply | Qty: 150 | Fill #1

## 2019-02-21 MED FILL — AMLODIPINE BESYLATE 5 MG TA: 5 | 90 days supply | Qty: 90 | Fill #1

## 2019-03-06 ENCOUNTER — Other Ambulatory Visit: Payer: Self-pay | Admitting: Gastroenterology

## 2019-03-07 NOTE — Telephone Encounter (Signed)
refill 

## 2019-03-08 ENCOUNTER — Other Ambulatory Visit: Payer: Self-pay | Admitting: Pharmacist

## 2019-03-08 MED ORDER — HUMIRA (2 PEN) 40 MG/0.4ML ~~LOC~~ AJKT: 40.0000 mg | AUTO-INJECTOR | SUBCUTANEOUS | 6 refills | Status: DC

## 2019-03-11 MED FILL — HUMIRA PEN 40 MG/0.4ML PNKT: 40 | 28 days supply | Qty: 4 | Fill #0

## 2019-03-25 ENCOUNTER — Other Ambulatory Visit: Payer: Self-pay | Admitting: Internal Medicine

## 2019-03-25 MED FILL — LOSARTAN POTASSIUM 50 MG TA: 50 | 30 days supply | Qty: 60 | Fill #2

## 2019-03-26 MED FILL — AZATHIOPRINE 50 MG TABS: 50 | 30 days supply | Qty: 150 | Fill #2

## 2019-03-26 MED FILL — METOPROLOL TARTRATE 50 MG T: 50 | 30 days supply | Qty: 90 | Fill #0

## 2019-03-28 ENCOUNTER — Other Ambulatory Visit: Payer: Self-pay | Admitting: Internal Medicine

## 2019-04-03 MED FILL — HUMIRA PEN 40 MG/0.4ML PNKT: 40 | 28 days supply | Qty: 4 | Fill #1

## 2019-04-19 ENCOUNTER — Ambulatory Visit: Payer: 59 | Admitting: Internal Medicine

## 2019-04-23 ENCOUNTER — Ambulatory Visit: Payer: 59 | Admitting: Gastroenterology

## 2019-05-01 MED FILL — AZATHIOPRINE 50 MG TABS: 50 | 30 days supply | Qty: 150 | Fill #3

## 2019-05-01 MED FILL — LOSARTAN POTASSIUM 50 MG TA: 50 | 90 days supply | Qty: 180 | Fill #3

## 2019-05-01 MED FILL — METOPROLOL TARTRATE 50 MG T: 50 | 30 days supply | Qty: 90 | Fill #1

## 2019-05-02 MED FILL — HUMIRA PEN 40 MG/0.4ML PNKT: 40 | 28 days supply | Qty: 4 | Fill #2

## 2019-05-30 MED FILL — HUMIRA PEN 40 MG/0.4ML PNKT: 40 | 28 days supply | Qty: 4 | Fill #3

## 2019-05-31 ENCOUNTER — Ambulatory Visit (INDEPENDENT_AMBULATORY_CARE_PROVIDER_SITE_OTHER): Payer: 59 | Admitting: Internal Medicine

## 2019-05-31 ENCOUNTER — Encounter: Payer: Self-pay | Admitting: Internal Medicine

## 2019-05-31 ENCOUNTER — Other Ambulatory Visit: Payer: Self-pay

## 2019-05-31 VITALS — BP 127/82 | HR 74 | Wt 234.0 lb

## 2019-05-31 DIAGNOSIS — F419 Anxiety disorder, unspecified: Secondary | ICD-10-CM

## 2019-05-31 DIAGNOSIS — R519 Headache, unspecified: Secondary | ICD-10-CM | POA: Diagnosis not present
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
## 2019-06-02 DIAGNOSIS — F419 Anxiety disorder, unspecified: Secondary | ICD-10-CM | POA: Insufficient documentation

## 2019-06-02 DIAGNOSIS — F32A Depression, unspecified: Secondary | ICD-10-CM | POA: Insufficient documentation
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
## 2019-06-25 MED FILL — AZATHIOPRINE 50 MG TABS: 50 | 30 days supply | Qty: 150 | Fill #4

## 2019-06-25 MED FILL — METOPROLOL TARTRATE 50 MG T: 50 | 30 days supply | Qty: 90 | Fill #2

## 2019-06-27 MED FILL — HUMIRA PEN 40 MG/0.4ML PNKT: 40 | 28 days supply | Qty: 4 | Fill #4

## 2019-07-08 ENCOUNTER — Ambulatory Visit: Payer: 59 | Admitting: Internal Medicine

## 2019-07-18 ENCOUNTER — Other Ambulatory Visit: Payer: Self-pay | Admitting: Internal Medicine

## 2019-07-18 MED FILL — AMLODIPINE BESYLATE 5 MG TA: 5 | 90 days supply | Qty: 90 | Fill #0

## 2019-07-30 ENCOUNTER — Telehealth: Payer: Self-pay | Admitting: Gastroenterology

## 2019-07-30 MED ORDER — HUMIRA (2 PEN) 40 MG/0.4ML ~~LOC~~ AJKT: 40.0000 mg | AUTO-INJECTOR | SUBCUTANEOUS | 6 refills | Status: DC

## 2019-07-30 NOTE — Telephone Encounter (Signed)
humira prior auth started with [facility].  Pt aware

## 2019-08-02 ENCOUNTER — Ambulatory Visit: Payer: 59 | Admitting: Internal Medicine

## 2019-08-02 ENCOUNTER — Encounter: Payer: Self-pay | Admitting: Internal Medicine

## 2019-08-02 ENCOUNTER — Other Ambulatory Visit: Payer: Self-pay

## 2019-08-02 VITALS — BP 155/89 | HR 73 | Temp 96.3°F | Resp 18 | Ht 66.0 in | Wt 228.4 lb

## 2019-08-02 DIAGNOSIS — L919 Hypertrophic disorder of the skin, unspecified: Secondary | ICD-10-CM | POA: Diagnosis not present

## 2019-08-02 DIAGNOSIS — L918 Other hypertrophic disorders of the skin: Secondary | ICD-10-CM | POA: Diagnosis not present
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
## 2019-08-02 NOTE — Patient Instructions (Signed)
Use a Band-Aid  Keep the area clean and dry.  Watch for signs of infection: Redness, swelling, fever, chills, discharge.  If that is the case call the office immediately  If you bleed apply pressure for 10 minutes, if the bleeding continue let us know

## 2019-08-02 NOTE — Progress Notes (Signed)
Pre visit review using our clinic review tool, if applicable. No additional management support is needed unless otherwise documented below in the visit note. 

## 2019-08-02 NOTE — Assessment & Plan Note (Addendum)
Skin tags: One of them is symptomatic due to location, R inner leg.Plan: removal  Procedure note: After obtaining verbal consent, the area was cleaned and in a sterile fashion I placed a small amount of lidocaine 2% without epinephrine, less than 1 mL. Then I proceeded to cut the skin tag with scissors. Minimal bleeding, the area was cauterized with silver nitrate. Instructions for wound care provided, see AVS.  He verbalized understanding. Pathology sent Patient tolerated procedure really well

## 2019-08-07 ENCOUNTER — Other Ambulatory Visit: Payer: Self-pay | Admitting: Pharmacist

## 2019-08-07 ENCOUNTER — Other Ambulatory Visit: Payer: Self-pay

## 2019-08-07 MED ORDER — HUMIRA PEN 40 MG/0.8ML ~~LOC~~ PNKT: 40.0000 mg | PEN_INJECTOR | SUBCUTANEOUS | 6 refills | Status: DC

## 2019-08-07 MED FILL — METOPROLOL TARTRATE 50 MG T: 50 | 30 days supply | Qty: 90 | Fill #3

## 2019-08-07 NOTE — Telephone Encounter (Signed)
[employer] denied Humira citrate free but did approve Humira 40 mg/0.8 ml.  Pt aware prescription sent

## 2019-08-13 ENCOUNTER — Encounter: Payer: Self-pay | Admitting: Gastroenterology

## 2019-08-13 ENCOUNTER — Ambulatory Visit: Payer: 59 | Admitting: Gastroenterology

## 2019-08-13 VITALS — BP 122/80 | HR 68 | Ht 67.0 in | Wt 232.0 lb

## 2019-08-13 DIAGNOSIS — K51919 Ulcerative colitis, unspecified with unspecified complications: Secondary | ICD-10-CM | POA: Diagnosis not present

## 2019-08-13 MED FILL — azaTHIOprine 50 MG TABS: 50 | 30 days supply | Qty: 150 | Fill #5
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
## 2019-08-13 NOTE — Patient Instructions (Signed)
If you are age 45 or older, your body mass index should be between 23-30. Your Body mass index is 36.34 kg/m. If this is out of the aforementioned range listed, please consider follow up with your Primary Care Provider.  If you are age 78 or younger, your body mass index should be between 19-25. Your Body mass index is 36.34 kg/m. If this is out of the aformentioned range listed, please consider follow up with your Primary Care Provider.   Your provider has requested that you go to the basement level for lab work before leaving today. Press "B" on the elevator. The lab is located at the first door on the left as you exit the elevator.  Due to recent changes in healthcare laws, you may see the results of your imaging and laboratory studies on MyChart before your provider has had a chance to review them.  We understand that in some cases there may be results that are confusing or concerning to you. Not all laboratory results come back in the same time frame and the provider may be waiting for multiple results in order to interpret others.  Please give [place] 48 hours in order for your provider to thoroughly review all the results before contacting the office for clarification of your results.   You will be seen in the office for follow up in 6 months ( November 2021).  Thank you for entrusting me with your care and choosing [facility].  Dr Ardis Hughs

## 2019-08-28 ENCOUNTER — Telehealth: Payer: Self-pay | Admitting: Gastroenterology

## 2019-08-28 MED ORDER — HUMIRA (2 PEN) 40 MG/0.4ML ~~LOC~~ AJKT: 40.0000 mg | AUTO-INJECTOR | SUBCUTANEOUS | 6 refills | Status: DC

## 2019-08-28 NOTE — Telephone Encounter (Signed)
Pt called stating that he received Humira with citrate. Pt states that he cannot take it because it burns a lot. If his insurance does not cover Humira citrate free, he would like to try something different. Pls call him.

## 2019-08-28 NOTE — Telephone Encounter (Signed)
Patient reports that he tired the Humira 40 mg/0.8 ml.  He says the burning is unbearable and wants to switch to the citrate free.  Prior auth initiated with insurance for citrate free 40 mg weekly.  Patient notified that this has been initiated and that insurance will make a determination in the next 72 hours.

## 2019-09-04 ENCOUNTER — Other Ambulatory Visit: Payer: Self-pay | Admitting: Pharmacist

## 2019-09-04 MED ORDER — HUMIRA (2 PEN) 40 MG/0.4ML ~~LOC~~ AJKT: 40.0000 mg | AUTO-INJECTOR | SUBCUTANEOUS | 6 refills | Status: DC

## 2019-09-04 MED FILL — HUMIRA PEN 40 MG/0.4ML PNKT: 40 | 28 days supply | Qty: 4 | Fill #0

## 2019-09-09 ENCOUNTER — Other Ambulatory Visit: Payer: Self-pay | Admitting: Internal Medicine

## 2019-09-09 MED FILL — LOSARTAN POTASSIUM 50 MG TA: 50 | 90 days supply | Qty: 180 | Fill #0

## 2019-09-09 MED FILL — AZATHIOPRINE 50 MG TABS: 50 | 30 days supply | Qty: 150 | Fill #6

## 2019-09-09 MED FILL — METOPROLOL TARTRATE 50 MG T: 50 | 90 days supply | Qty: 270 | Fill #0

## 2019-09-26 MED FILL — HUMIRA PEN 40 MG/0.4ML PNKT: 40 | 28 days supply | Qty: 4 | Fill #1

## 2019-10-14 MED FILL — AZATHIOPRINE 50 MG TABS: 50 | 30 days supply | Qty: 150 | Fill #0

## 2019-10-28 MED FILL — HUMIRA PEN 40 MG/0.4ML PNKT: 40 | 28 days supply | Qty: 4 | Fill #2

## 2019-11-04 MED FILL — AMLODIPINE BESYLATE 5 MG TA: 5 | 90 days supply | Qty: 90 | Fill #1

## 2019-11-18 MED FILL — AZATHIOPRINE 50 MG TABS: 50 | 30 days supply | Qty: 150 | Fill #1

## 2019-11-21 MED FILL — HUMIRA PEN 40 MG/0.4ML PNKT: 40 | 28 days supply | Qty: 4 | Fill #3

## 2019-12-09 MED FILL — METOPROLOL TARTRATE 50 MG T: 50 | 90 days supply | Qty: 270 | Fill #1

## 2019-12-09 MED FILL — LOSARTAN POTASSIUM 50 MG TA: 50 | 90 days supply | Qty: 180 | Fill #1

## 2019-12-19 MED FILL — HUMIRA PEN 40 MG/0.4ML PNKT: 40 | 28 days supply | Qty: 4 | Fill #4

## 2019-12-23 MED FILL — AZATHIOPRINE 50 MG TABS: 50 | 30 days supply | Qty: 150 | Fill #2

## 2020-01-03 ENCOUNTER — Encounter: Payer: Self-pay | Admitting: Internal Medicine

## 2020-01-03 ENCOUNTER — Ambulatory Visit: Payer: 59 | Admitting: Internal Medicine

## 2020-01-03 ENCOUNTER — Other Ambulatory Visit: Payer: Self-pay

## 2020-01-03 VITALS — BP 142/97 | HR 73 | Temp 97.5°F | Resp 16 | Ht 67.0 in | Wt 227.5 lb

## 2020-01-03 DIAGNOSIS — R739 Hyperglycemia, unspecified: Secondary | ICD-10-CM | POA: Diagnosis not present

## 2020-01-03 DIAGNOSIS — Z23 Encounter for immunization: Secondary | ICD-10-CM

## 2020-01-03 DIAGNOSIS — E669 Obesity, unspecified: Secondary | ICD-10-CM

## 2020-01-03 DIAGNOSIS — R7989 Other specified abnormal findings of blood chemistry: Secondary | ICD-10-CM

## 2020-01-03 DIAGNOSIS — Z6835 Body mass index (BMI) 35.0-35.9, adult: Secondary | ICD-10-CM | POA: Diagnosis not present

## 2020-01-03 DIAGNOSIS — I10 Essential (primary) hypertension: Secondary | ICD-10-CM | POA: Diagnosis not present
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
## 2020-01-03 NOTE — Progress Notes (Signed)
Pre visit review using our clinic review tool, if applicable. No additional management support is needed unless otherwise documented below in the visit note. 

## 2020-01-03 NOTE — Patient Instructions (Addendum)
Check the  blood pressure regularly  BP GOAL is between 110/65 and  135/85. If it is consistently higher or lower, let me know   For weight loss: Consider weight watchers Consider calorie counting, you may like to use an app called MYFINESSPAL Also consider visit the  wellness clinic   [place] [facility] : Get the blood work     [place], [place] back for a physical exam in 3 months fasting

## 2020-01-04 LAB — COMPREHENSIVE METABOLIC PANEL
AG Ratio: 1.5 (calc) (ref 1.0–2.5)
ALT: 21 U/L (ref 9–46)
AST: 18 U/L (ref 10–40)
Albumin: 4.4 g/dL (ref 3.6–5.1)
Alkaline phosphatase (APISO): 68 U/L (ref 36–130)
BUN: 11 mg/dL (ref 7–25)
CO2: 28 mmol/L (ref 20–32)
Calcium: 9.8 mg/dL (ref 8.6–10.3)
Chloride: 105 mmol/L (ref 98–110)
Creat: 1.33 mg/dL (ref 0.60–1.35)
Globulin: 2.9 g/dL (calc) (ref 1.9–3.7)
Glucose, Bld: 98 mg/dL (ref 65–99)
Potassium: 4.5 mmol/L (ref 3.5–5.3)
Sodium: 144 mmol/L (ref 135–146)
Total Bilirubin: 1.2 mg/dL (ref 0.2–1.2)
Total Protein: 7.3 g/dL (ref 6.1–8.1)

## 2020-01-04 LAB — CBC WITH DIFFERENTIAL/PLATELET
Absolute Monocytes: 687 cells/uL (ref 200–950)
Basophils Absolute: 26 cells/uL (ref 0–200)
Basophils Relative: 0.3 %
Eosinophils Absolute: 78 cells/uL (ref 15–500)
Eosinophils Relative: 0.9 %
HCT: 43.8 % (ref 38.5–50.0)
Hemoglobin: 15.4 g/dL (ref 13.2–17.1)
Lymphs Abs: 1409 cells/uL (ref 850–3900)
MCH: 34.5 pg — ABNORMAL HIGH (ref 27.0–33.0)
MCHC: 35.2 g/dL (ref 32.0–36.0)
MCV: 98 fL (ref 80.0–100.0)
MPV: 10.9 fL (ref 7.5–12.5)
Monocytes Relative: 7.9 %
Neutro Abs: 6499 cells/uL (ref 1500–7800)
Neutrophils Relative %: 74.7 %
Platelets: 343 10*3/uL (ref 140–400)
RBC: 4.47 10*6/uL (ref 4.20–5.80)
RDW: 14.4 % (ref 11.0–15.0)
Total Lymphocyte: 16.2 %
WBC: 8.7 10*3/uL (ref 3.8–10.8)

## 2020-01-04 LAB — HEMOGLOBIN A1C
Hgb A1c MFr Bld: 5.8 % of total Hgb — ABNORMAL HIGH (ref <5.7)
Mean Plasma Glucose: 120 (calc)
eAG (mmol/L): 6.6 (calc)

## 2020-01-04 LAB — TSH: TSH: 1.4 mIU/L (ref 0.40–4.50)

## 2020-01-04 LAB — VITAMIN B12: Vitamin B-12: 687 pg/mL (ref 200–1100)

## 2020-01-05 NOTE — Assessment & Plan Note (Signed)
Hyperglycemia: Checking A1c HTN, on amlodipine, losartan, metoprolol.  BPs at home in the 120s/80s, check a CMP, CBC. No change Obesity: BMI 35, eating healthy, exercising daily and he has been unable to lose weight. Suggest weight watchers, calorie counting or a visit to the  wellness clinic, information provided.  Check a TSH Abnormal CBC: MCV was slightly elevated, recheck CBC and B12 Preventive care: Recommend Pfizer COVID booster 04-2020 Flu shot today RTC 3 months CPX

## 2020-01-16 MED FILL — HUMIRA PEN 40 MG/0.4ML PNKT: 40 | 28 days supply | Qty: 4 | Fill #5

## 2020-01-20 ENCOUNTER — Other Ambulatory Visit: Payer: Self-pay | Admitting: Gastroenterology

## 2020-01-20 MED FILL — AZATHIOPRINE 50 MG TABS: 50 | 30 days supply | Qty: 150 | Fill #0

## 2020-01-29 ENCOUNTER — Other Ambulatory Visit: Payer: Self-pay

## 2020-01-29 ENCOUNTER — Ambulatory Visit (HOSPITAL_BASED_OUTPATIENT_CLINIC_OR_DEPARTMENT_OTHER): Payer: 59 | Admitting: Pharmacist

## 2020-01-29 DIAGNOSIS — Z79899 Other long term (current) drug therapy: Secondary | ICD-10-CM

## 2020-01-29 NOTE — Progress Notes (Signed)
   S: Patient presents  for review of their specialty medication therapy.  Patient is currently taking Humira for ulcerative colitis. Patient is managed by Dr. Ardis Hughs for this.   Adherence: confirms  Efficacy: he continues to do well on Humira with good results   Dosing:  Ulcerative colitis: 40 mg every week.  Dose adjustments: Renal: no dose adjustments (has not been studied) Hepatic: no dose adjustments (has not been studied)  Screening: TB test: negative Hepatitis: negative  Monitoring: S/sx of injection site reactions: denies  S/sx of infection: denies CBC: see below S/sx of hypersensitivity: denies S/sx of malignancy: denies S/sx of heart failure: denies  O:  Lab Results  Component Value Date   WBC 8.7 01/03/2020   HGB 15.4 01/03/2020   HCT 43.8 01/03/2020   MCV 98.0 01/03/2020   PLT 343 01/03/2020      Chemistry      Component Value Date/Time   NA 144 01/03/2020 1608   K 4.5 01/03/2020 1608   CL 105 01/03/2020 1608   CO2 28 01/03/2020 1608   BUN 11 01/03/2020 1608   CREATININE 1.33 01/03/2020 1608      Component Value Date/Time   CALCIUM 9.8 01/03/2020 1608   ALKPHOS 58 08/24/2018 0953   AST 18 01/03/2020 1608   ALT 21 01/03/2020 1608   BILITOT 1.2 01/03/2020 1608      A/P: 1. Medication review: Patient currently on Humira for ulcerative colitis. Reviewed the medication with the patient, including the following: Humira is a TNF blocking agent indicated for ankylosing spondylitis, Crohn's disease, Hidradenitis suppurativa, psoriatic arthritis, plaque psoriasis, ulcerative colitis, and uveitis. Patient educated on purpose, proper use and potential adverse effects of Humira. The most common adverse effects are infections, headache, and injection site reactions. There is the possibility of an increased risk of malignancy but it is not well understood if this increased risk is due to there medication or the disease state. There are rare cases of  pancytopenia and aplastic anemia. No recommendations for any changes at this time.   Benard Halsted, PharmD, [place] [phone]

## 2020-02-03 ENCOUNTER — Other Ambulatory Visit: Payer: Self-pay | Admitting: Internal Medicine

## 2020-02-03 MED FILL — AMLODIPINE BESYLATE 5 MG TA: 5 | 90 days supply | Qty: 90 | Fill #0

## 2020-02-17 MED FILL — HUMIRA PEN 40 MG/0.4ML PNKT: 40 | 28 days supply | Qty: 4 | Fill #6

## 2020-02-17 MED FILL — AZATHIOPRINE 50 MG TABS: 50 | 30 days supply | Qty: 150 | Fill #1

## 2020-03-04 ENCOUNTER — Ambulatory Visit: Payer: 59 | Admitting: Gastroenterology

## 2020-03-10 ENCOUNTER — Other Ambulatory Visit: Payer: Self-pay | Admitting: Internal Medicine

## 2020-03-10 ENCOUNTER — Other Ambulatory Visit: Payer: Self-pay | Admitting: Pharmacist

## 2020-03-10 ENCOUNTER — Other Ambulatory Visit: Payer: Self-pay | Admitting: Gastroenterology

## 2020-03-10 MED ORDER — HUMIRA (2 PEN) 40 MG/0.4ML ~~LOC~~ AJKT: 40.0000 mg | AUTO-INJECTOR | SUBCUTANEOUS | 6 refills | Status: DC

## 2020-03-16 ENCOUNTER — Other Ambulatory Visit: Payer: Self-pay | Admitting: Internal Medicine

## 2020-03-16 MED FILL — LOSARTAN POTASSIUM 50 MG TA: 50 | 30 days supply | Qty: 60 | Fill #0

## 2020-03-16 MED FILL — METOPROLOL TARTRATE 50 MG T: 50 | 90 days supply | Qty: 270 | Fill #0

## 2020-03-16 MED FILL — HUMIRA PEN 40 MG/0.4ML PNKT: 40 | 28 days supply | Qty: 4 | Fill #0

## 2020-03-16 MED FILL — AZATHIOPRINE 50 MG TABS: 50 | 30 days supply | Qty: 150 | Fill #2

## 2020-03-31 ENCOUNTER — Other Ambulatory Visit: Payer: Self-pay | Admitting: Internal Medicine

## 2020-03-31 ENCOUNTER — Telehealth (INDEPENDENT_AMBULATORY_CARE_PROVIDER_SITE_OTHER): Payer: 59 | Admitting: Internal Medicine

## 2020-03-31 ENCOUNTER — Encounter: Payer: Self-pay | Admitting: Internal Medicine

## 2020-03-31 VITALS — BP 126/82 | Ht 67.0 in

## 2020-03-31 DIAGNOSIS — F419 Anxiety disorder, unspecified: Secondary | ICD-10-CM | POA: Diagnosis not present

## 2020-03-31 DIAGNOSIS — F32A Depression, unspecified: Secondary | ICD-10-CM

## 2020-03-31 DIAGNOSIS — G43009 Migraine without aura, not intractable, without status migrainosus: Secondary | ICD-10-CM | POA: Diagnosis not present

## 2020-03-31 MED ORDER — SUMATRIPTAN SUCCINATE 50 MG PO TABS
ORAL_TABLET | ORAL | 0 refills | Status: DC
Start: 2020-03-31 — End: 2020-03-31

## 2020-03-31 MED FILL — SUMATRIPTAN SUCCINATE 50 MG: 50 | 30 days supply | Qty: 18 | Fill #0

## 2020-03-31 NOTE — Progress Notes (Signed)
Pre visit review using our clinic review tool, if applicable. No additional management support is needed unless otherwise documented below in the visit note. 
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
## 2020-04-01 NOTE — Assessment & Plan Note (Signed)
Anxiety, depression: His work environment and home environment has been deeply disrupted by the [place], see HPI.  He feels overwhelmed, anxious and depressed. We had a long conversation about it, listening therapy provided. Treatment options include exercise, counseling, medication.  He already started to exercise more, he is in search of a counselor, contact info for local resources sent, we talk about medication, he likes to think about it, will reach out if needed. Migraines: Reports she is having headaches, not worst of life, sometimes associated with light intolerance and nausea.  Previously Imitrex helped.  We will send a prescription.  He knows to take it only for migrainosus headaches, if the headaches escalate or they are different he will let me know.

## 2020-04-06 ENCOUNTER — Telehealth: Payer: 59 | Admitting: Internal Medicine

## 2020-04-08 ENCOUNTER — Encounter: Payer: 59 | Admitting: Internal Medicine

## 2020-04-08 MED FILL — HUMIRA PEN 40 MG/0.4ML PNKT: 40 | 28 days supply | Qty: 4 | Fill #1

## 2020-04-13 MED FILL — AZATHIOPRINE 50 MG TABS: 50 | 30 days supply | Qty: 150 | Fill #3

## 2020-04-13 MED FILL — LOSARTAN POTASSIUM 50 MG TA: 50 | 30 days supply | Qty: 60 | Fill #1

## 2020-04-16 MED FILL — AMLODIPINE BESYLATE 5 MG TA: 5 | 90 days supply | Qty: 90 | Fill #1

## 2020-04-20 ENCOUNTER — Other Ambulatory Visit (INDEPENDENT_AMBULATORY_CARE_PROVIDER_SITE_OTHER): Payer: 59

## 2020-04-20 DIAGNOSIS — K51919 Ulcerative colitis, unspecified with unspecified complications: Secondary | ICD-10-CM

## 2020-04-20 LAB — COMPREHENSIVE METABOLIC PANEL
ALT: 36 U/L (ref 0–53)
AST: 23 U/L (ref 0–37)
Albumin: 4.5 g/dL (ref 3.5–5.2)
Alkaline Phosphatase: 77 U/L (ref 39–117)
BUN: 12 mg/dL (ref 6–23)
CO2: 28 mEq/L (ref 19–32)
Calcium: 9.2 mg/dL (ref 8.4–10.5)
Chloride: 104 mEq/L (ref 96–112)
Creatinine, Ser: 1.16 mg/dL (ref 0.40–1.50)
GFR: 76.26 mL/min (ref >60.00)
Glucose, Bld: 106 mg/dL — ABNORMAL HIGH (ref 70–99)
Potassium: 3.3 mEq/L — ABNORMAL LOW (ref 3.5–5.1)
Sodium: 141 mEq/L (ref 135–145)
Total Bilirubin: 1.4 mg/dL — ABNORMAL HIGH (ref 0.2–1.2)
Total Protein: 7.9 g/dL (ref 6.0–8.3)

## 2020-04-20 LAB — CBC
HCT: 44.5 % (ref 39.0–52.0)
Hemoglobin: 15.2 g/dL (ref 13.0–17.0)
MCHC: 34.2 g/dL (ref 30.0–36.0)
MCV: 100.9 fl — ABNORMAL HIGH (ref 78.0–100.0)
Platelets: 355 10*3/uL (ref 150.0–400.0)
RBC: 4.41 Mil/uL (ref 4.22–5.81)
RDW: 15.5 % (ref 11.5–15.5)
WBC: 9.3 10*3/uL (ref 4.0–10.5)

## 2020-04-21 ENCOUNTER — Ambulatory Visit: Payer: 59 | Admitting: Gastroenterology

## 2020-04-22 ENCOUNTER — Ambulatory Visit (INDEPENDENT_AMBULATORY_CARE_PROVIDER_SITE_OTHER): Payer: 59 | Admitting: Internal Medicine

## 2020-04-22 ENCOUNTER — Encounter: Payer: Self-pay | Admitting: Internal Medicine

## 2020-04-22 ENCOUNTER — Other Ambulatory Visit: Payer: Self-pay

## 2020-04-22 VITALS — BP 170/100 | HR 71 | Temp 99.0°F | Resp 16 | Ht 67.0 in | Wt 230.2 lb

## 2020-04-22 DIAGNOSIS — Z0000 Encounter for general adult medical examination without abnormal findings: Secondary | ICD-10-CM | POA: Diagnosis not present

## 2020-04-22 DIAGNOSIS — F419 Anxiety disorder, unspecified: Secondary | ICD-10-CM

## 2020-04-22 DIAGNOSIS — R079 Chest pain, unspecified: Secondary | ICD-10-CM | POA: Diagnosis not present

## 2020-04-22 DIAGNOSIS — F32A Depression, unspecified: Secondary | ICD-10-CM

## 2020-04-22 DIAGNOSIS — Z0001 Encounter for general adult medical examination with abnormal findings: Secondary | ICD-10-CM

## 2020-04-22 DIAGNOSIS — I10 Essential (primary) hypertension: Secondary | ICD-10-CM

## 2020-04-22 NOTE — Patient Instructions (Addendum)
Check the  blood pressure twice a week. Watch her salt intake BP GOAL is between 110/65 and  135/85. If it is consistently higher or lower, let me know    GO TO THE LAB : Get the blood work     [place], [place] back for a checkup in 4 months, sooner if your blood pressure is not well controlled   Follow these instructions at home:  Take over-the-counter and prescription medicines only as told by your health care provider. This includes vitamins and supplements.  Eat a healthy diet. A healthy diet includes fresh fruits and vegetables, whole grains, healthy fats, and lean proteins.  If instructed, eat more foods that contain a lot of potassium. This includes: ? Nuts, such as peanuts and pistachios. ? Seeds, such as sunflower seeds and pumpkin seeds. ? Peas, lentils, and lima beans. ? Whole grain and bran cereals and breads. ? Fresh fruits and vegetables, such as apricots, avocado, bananas, cantaloupe, kiwi, oranges, tomatoes, asparagus, and potatoes. ? Orange juice. ? Tomato juice. ? Red meats. ? Yogurt.  Keep all follow-up visits as told by your health care provider. This is important.   Contact a health care provider if you:  Have weakness that gets worse.  Feel your heart pounding or racing.  Vomit.  Have diarrhea.  Have diabetes (diabetes mellitus) and you have trouble keeping your blood sugar (glucose) in your target range. Get help right away if you:  Have chest pain.  Have shortness of breath.  Have vomiting or diarrhea that lasts for more than 2 days.  Faint. Summary  Hypokalemia means that the amount of potassium in the blood is lower than normal.  This condition is diagnosed with a blood test.  Hypokalemia may be treated by taking potassium supplements, adjusting the medicines that you take, or eating more foods that are high in potassium.  If your potassium level is very low, you may need to get potassium  through an IV and be monitored in the hospital. This information is not intended to replace advice given to you by your health care provider. Make sure you discuss any questions you have with your health care provider. Document Revised: 10/18/2017 Document Reviewed: 10/18/2017 Elsevier Patient Education  [place].
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
## 2020-04-23 ENCOUNTER — Encounter: Payer: Self-pay | Admitting: Internal Medicine

## 2020-04-23 LAB — LIPID PANEL
Cholesterol: 146 mg/dL (ref 0–200)
HDL: 40.1 mg/dL (ref >39.00)
LDL Cholesterol: 91 mg/dL (ref 0–99)
NonHDL: 106.26
Total CHOL/HDL Ratio: 4
Triglycerides: 75 mg/dL (ref 0.0–149.0)
VLDL: 15 mg/dL (ref 0.0–40.0)

## 2020-04-23 LAB — HEMOGLOBIN A1C: Hgb A1c MFr Bld: 5.7 % (ref 4.6–6.5)

## 2020-04-23 NOTE — Assessment & Plan Note (Signed)
-  Td 2010 and ~ 2019 (@ work) -prevnar 11/2016 - pnm 23  08/18/2017 (w/ pnm 23 booster in 5 years) - COVID vaccine x2, booster soon -Had a flu shot - CCS: had a cscope 2019, next per GI, -Available labs reviewed: Check FLP and A1c. Last potassium 3.3.  On losartan, recommend high potassium diet. See AVS -Diet and exercise discussed

## 2020-04-23 NOTE — Assessment & Plan Note (Signed)
Here for CPX Hyperglycemia: Checking labs HTN: On losartan, metoprolol, amlodipine, at home BPs typically in the 130s, 140s. Upon arrival here: 170/100, BP recheck: 150/92.  Recommend no change for now, monitor BPs closely, RTC 4 months, sooner if needed. Chest pain: As described above, in the context of lifting with his arms, likely MSK. EKG today: NSR.  Recommend observation. Anxiety depression: See last visit, not on medications but is talking with a counselor and is helping a lot.  Feels better. RTC 4 months.

## 2020-04-25 LAB — ADALIMUMAB+AB (SERIAL MONITOR)
Adalimumab Drug Level: 11 ug/mL
Anti-Adalimumab Antibody: <25 ng/mL

## 2020-05-06 MED FILL — HUMIRA PEN 40 MG/0.4ML PNKT: 40 | 28 days supply | Qty: 4 | Fill #2

## 2020-05-07 MED FILL — LOSARTAN POTASSIUM 50 MG TA: 50 | 30 days supply | Qty: 60 | Fill #2

## 2020-05-25 MED FILL — AZATHIOPRINE 50 MG TABS: 50 | 30 days supply | Qty: 150 | Fill #4

## 2020-06-15 ENCOUNTER — Other Ambulatory Visit: Payer: Self-pay | Admitting: Internal Medicine

## 2020-06-15 MED FILL — LOSARTAN POTASSIUM 50 MG TA: 50 | 90 days supply | Qty: 180 | Fill #0

## 2020-06-15 MED FILL — METOPROLOL TARTRATE 50 MG T: 50 | 90 days supply | Qty: 270 | Fill #0

## 2020-06-18 ENCOUNTER — Other Ambulatory Visit (HOSPITAL_COMMUNITY): Payer: Self-pay

## 2020-06-18 MED FILL — AZATHIOPRINE 50 MG TABS: 50 | 30 days supply | Qty: 150 | Fill #5

## 2020-07-01 ENCOUNTER — Other Ambulatory Visit (HOSPITAL_COMMUNITY): Payer: Self-pay

## 2020-07-01 MED FILL — Adalimumab Auto-injector Kit 40 MG/0.4ML: SUBCUTANEOUS | 28 days supply | Qty: 4 | Fill #0 | Status: AC

## 2020-07-02 ENCOUNTER — Other Ambulatory Visit (HOSPITAL_COMMUNITY): Payer: Self-pay

## 2020-07-13 ENCOUNTER — Encounter: Payer: Self-pay | Admitting: Internal Medicine

## 2020-07-15 ENCOUNTER — Other Ambulatory Visit (HOSPITAL_COMMUNITY): Payer: Self-pay

## 2020-07-15 ENCOUNTER — Ambulatory Visit: Payer: 59 | Admitting: Gastroenterology

## 2020-07-15 ENCOUNTER — Encounter: Payer: Self-pay | Admitting: Gastroenterology

## 2020-07-15 VITALS — BP 140/80 | HR 67 | Ht 68.0 in | Wt 230.8 lb

## 2020-07-15 DIAGNOSIS — K51919 Ulcerative colitis, unspecified with unspecified complications: Secondary | ICD-10-CM | POA: Diagnosis not present

## 2020-07-15 MED ORDER — AZATHIOPRINE 50 MG PO TABS
200.0000 mg | ORAL_TABLET | Freq: Every day | ORAL | 11 refills | Status: DC
  Filled 2020-07-15: qty 120, 30d supply, fill #0
  Filled 2020-08-24: qty 120, 30d supply, fill #1
  Filled 2020-09-23: qty 120, 30d supply, fill #2
  Filled 2020-10-25: qty 120, 30d supply, fill #3
  Filled 2020-11-22: qty 120, 30d supply, fill #4
  Filled 2020-12-22: qty 120, 30d supply, fill #5
  Filled 2021-02-01: qty 120, 30d supply, fill #6
  Filled 2021-03-01: qty 120, 30d supply, fill #7
  Filled 2021-04-05: qty 120, 30d supply, fill #8
  Filled 2021-05-04: qty 120, 30d supply, fill #9
  Filled 2021-06-07: qty 120, 30d supply, fill #10
  Filled 2021-07-05: qty 120, 30d supply, fill #11

## 2020-07-15 NOTE — Patient Instructions (Addendum)
If you are age 46 or younger, your body mass index should be between 19-25. Your Body mass index is 35.09 kg/m. If this is out of the aformentioned range listed, please consider follow up with your Primary Care Provider.   Your provider has requested that you go to the basement level for lab work before leaving today. Press "B" on the elevator. The lab is located at the first door on the left as you exit the elevator.  Due to recent changes in healthcare laws, you may see the results of your imaging and laboratory studies on MyChart before your provider has had a chance to review them.  We understand that in some cases there may be results that are confusing or concerning to you. Not all laboratory results come back in the same time frame and the provider may be waiting for multiple results in order to interpret others.  Please give [place] 48 hours in order for your provider to thoroughly review all the results before contacting the office for clarification of your results.   DECREASE: azathioprine 61m take 4 tablets daily = 2056m CONTINUE: Humira [id number].  You will follow up with our office in 1 year. We will contact you to schedule appointment.  Thank you for entrusting me with your care and [facility]
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
## 2020-07-16 ENCOUNTER — Telehealth (INDEPENDENT_AMBULATORY_CARE_PROVIDER_SITE_OTHER): Payer: 59 | Admitting: Internal Medicine

## 2020-07-16 ENCOUNTER — Other Ambulatory Visit (HOSPITAL_COMMUNITY): Payer: Self-pay

## 2020-07-16 ENCOUNTER — Other Ambulatory Visit: Payer: Self-pay

## 2020-07-16 ENCOUNTER — Encounter: Payer: Self-pay | Admitting: Internal Medicine

## 2020-07-16 VITALS — Ht 68.0 in | Wt 230.0 lb

## 2020-07-16 DIAGNOSIS — F32A Depression, unspecified: Secondary | ICD-10-CM

## 2020-07-16 DIAGNOSIS — F419 Anxiety disorder, unspecified: Secondary | ICD-10-CM | POA: Diagnosis not present

## 2020-07-16 DIAGNOSIS — G43009 Migraine without aura, not intractable, without status migrainosus: Secondary | ICD-10-CM

## 2020-07-16 DIAGNOSIS — R519 Headache, unspecified: Secondary | ICD-10-CM
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
## 2020-07-18 NOTE — Assessment & Plan Note (Signed)
Headaches: Not well controlled, request FMLA mostly for that reason but also because anxiety. Not taking sumatriptan regularly because it caused GI upset.  Has not seen his neurology recently. Plan: Refer to neurology Anxiety, depression: A lot of stress in his life, personal and work related.  Feels overwhelmed.  He sees a Social worker, I recommended medication to help deal with this problem at least temporarily, he declines. I will recommend 2 days off for now (unfortunately, has a family member in the hospital in critical condition, needs help to deal with the situation) .  Also recommend to continue talking with his counselor. FMLA?:  Request FMLA due to headaches and anxiety however he is not getting treatment for those conditions.  Would recommend time off if treatment fails and in coordination with neurology and a counselor.

## 2020-07-29 ENCOUNTER — Other Ambulatory Visit (HOSPITAL_COMMUNITY): Payer: Self-pay

## 2020-07-29 MED FILL — Adalimumab Auto-injector Kit 40 MG/0.4ML: SUBCUTANEOUS | 28 days supply | Qty: 4 | Fill #1 | Status: AC

## 2020-08-03 ENCOUNTER — Other Ambulatory Visit: Payer: Self-pay | Admitting: Internal Medicine

## 2020-08-03 ENCOUNTER — Other Ambulatory Visit (HOSPITAL_COMMUNITY): Payer: Self-pay

## 2020-08-03 MED ORDER — AMLODIPINE BESYLATE 5 MG PO TABS
5.0000 mg | ORAL_TABLET | Freq: Every day | ORAL | 1 refills | Status: DC
  Filled 2020-08-03: qty 90, 90d supply, fill #0
  Filled 2020-11-10: qty 90, 90d supply, fill #1

## 2020-08-07 ENCOUNTER — Telehealth: Payer: Self-pay | Admitting: Internal Medicine

## 2020-08-07 NOTE — Telephone Encounter (Signed)
FMLA paperwork faxed into front office Placed into Paz bin up front

## 2020-08-10 NOTE — Telephone Encounter (Signed)
Received and will place in Dr Larose Kells folder to be filled out. -JMA

## 2020-08-11 ENCOUNTER — Telehealth: Payer: Self-pay | Admitting: Internal Medicine

## 2020-08-11 NOTE — Telephone Encounter (Signed)
Duplicate paperwork. Shredded.

## 2020-08-11 NOTE — Telephone Encounter (Signed)
Document faxed in for provider to fill out ([employer] - 5 pages) Document put at front office tray under providers name.

## 2020-08-11 NOTE — Telephone Encounter (Signed)
Mychart message sent to Pt for clarification- PCP note on 07/16/20 stated he would give 2 days to start- informed Pt I needed clarification on the days he is requesting. Awaiting response.

## 2020-08-11 NOTE — Telephone Encounter (Signed)
Pt needing for COVID-19, he tested positive on 08/03/20, was out of work from 08/04/2020 to 08/07/2020.

## 2020-08-12 NOTE — Telephone Encounter (Signed)
Form completed, in PCP red folder to be signed.

## 2020-08-14 DIAGNOSIS — Z0279 Encounter for issue of other medical certificate: Secondary | ICD-10-CM

## 2020-08-18 NOTE — Telephone Encounter (Signed)
Received fax confirmation

## 2020-08-18 NOTE — Telephone Encounter (Signed)
Form faxed to [employer] at [phone]. Form sent for scanning.

## 2020-08-21 ENCOUNTER — Ambulatory Visit: Payer: 59 | Admitting: Internal Medicine

## 2020-08-24 ENCOUNTER — Other Ambulatory Visit (HOSPITAL_COMMUNITY): Payer: Self-pay

## 2020-08-24 MED FILL — Sumatriptan Succinate Tab 50 MG: ORAL | 28 days supply | Qty: 9 | Fill #0 | Status: AC

## 2020-08-25 ENCOUNTER — Other Ambulatory Visit (HOSPITAL_COMMUNITY): Payer: Self-pay

## 2020-08-25 MED FILL — Adalimumab Auto-injector Kit 40 MG/0.4ML: SUBCUTANEOUS | 28 days supply | Qty: 4 | Fill #2 | Status: CN

## 2020-08-31 ENCOUNTER — Other Ambulatory Visit (HOSPITAL_COMMUNITY): Payer: Self-pay

## 2020-09-02 ENCOUNTER — Other Ambulatory Visit (HOSPITAL_COMMUNITY): Payer: Self-pay

## 2020-09-02 ENCOUNTER — Telehealth: Payer: Self-pay | Admitting: Gastroenterology

## 2020-09-02 MED FILL — Adalimumab Auto-injector Kit 40 MG/0.4ML: SUBCUTANEOUS | 28 days supply | Qty: 4 | Fill #2 | Status: CN

## 2020-09-02 NOTE — Telephone Encounter (Signed)
Pt called to f/u on PA for Humira. His pharmacy told him that they fax PA form last Monday. He does not want to miss his dose today. Pls call him.

## 2020-09-02 NOTE — Telephone Encounter (Signed)
Prior authorization received today and completed in Cover My Meds as urgent.  Pt aware

## 2020-09-04 ENCOUNTER — Other Ambulatory Visit (HOSPITAL_COMMUNITY): Payer: Self-pay

## 2020-09-04 MED FILL — Adalimumab Auto-injector Kit 40 MG/0.4ML: SUBCUTANEOUS | 28 days supply | Qty: 4 | Fill #2 | Status: CN

## 2020-09-08 ENCOUNTER — Other Ambulatory Visit (HOSPITAL_COMMUNITY): Payer: Self-pay

## 2020-09-08 MED FILL — Adalimumab Auto-injector Kit 40 MG/0.4ML: SUBCUTANEOUS | 28 days supply | Qty: 4 | Fill #2 | Status: CN

## 2020-09-08 NOTE — Telephone Encounter (Signed)
Inbound from patient requesting a update on prior authorization please.

## 2020-09-09 ENCOUNTER — Other Ambulatory Visit (HOSPITAL_COMMUNITY): Payer: Self-pay

## 2020-09-09 NOTE — Telephone Encounter (Signed)
Spoke with patient, he states that he is frustrated because he has not had his medication for 2 weeks now. Advised that we have received a request from [employer] for more information. I have faxed the last OV note and labs from this year to the PA department. I advised patient that we are doing the best we can to get the situation handled. Advised that once we have a response from the insurance company he will be notified. Advised that the pharmacy may contact him first. Patient thanked me for the return call, verbalized understanding and had no concerns at the end of the call.

## 2020-09-10 NOTE — Telephone Encounter (Signed)
Called pharmacy help desk at [phone] to follow up on request for additional information. Advised that information was faxed yesterday, they have not received the records yet. Regenia Skeeter V advised that I re-fax the information to [phone]. Re-faxed all information to both fax numbers.

## 2020-09-11 ENCOUNTER — Other Ambulatory Visit (HOSPITAL_COMMUNITY): Payer: Self-pay

## 2020-09-11 MED FILL — Adalimumab Auto-injector Kit 40 MG/0.4ML: SUBCUTANEOUS | 28 days supply | Qty: 4 | Fill #2 | Status: CN

## 2020-09-11 NOTE — Telephone Encounter (Signed)
Inbound call from patient have questions about PA to for pharmacy states they do not have the PA. Wants to speak to office manager about this issue. Says he is getting the run around. Best contact number [phone]

## 2020-09-11 NOTE — Telephone Encounter (Signed)
Received a call form Monica with [employer] PA Dept, she states that she is with the patient's medical benefit plan and will need a PA for Entyvio. She states that the patient called and initiated this request. Advised that patient is on Humira and has been on this treatment for a while so that is incorrect. Brayton Layman states that they will disregard request.

## 2020-09-14 ENCOUNTER — Other Ambulatory Visit (HOSPITAL_COMMUNITY): Payer: Self-pay

## 2020-09-14 MED FILL — Losartan Potassium Tab 50 MG: ORAL | 90 days supply | Qty: 180 | Fill #0 | Status: AC

## 2020-09-14 MED FILL — Adalimumab Auto-injector Kit 40 MG/0.4ML: SUBCUTANEOUS | 28 days supply | Qty: 4 | Fill #2 | Status: CN

## 2020-09-14 MED FILL — Metoprolol Tartrate Tab 50 MG: ORAL | 90 days supply | Qty: 270 | Fill #0 | Status: AC

## 2020-09-14 NOTE — Telephone Encounter (Signed)
PA was initiated via Cover my meds as urgent on 6/15.  Waiting for insurance response.

## 2020-09-15 ENCOUNTER — Other Ambulatory Visit (HOSPITAL_COMMUNITY): Payer: Self-pay

## 2020-09-15 MED FILL — Adalimumab Auto-injector Kit 40 MG/0.4ML: SUBCUTANEOUS | 28 days supply | Qty: 4 | Fill #2 | Status: CN

## 2020-09-17 ENCOUNTER — Other Ambulatory Visit (HOSPITAL_COMMUNITY): Payer: Self-pay

## 2020-09-17 MED FILL — Adalimumab Auto-injector Kit 40 MG/0.4ML: SUBCUTANEOUS | 28 days supply | Qty: 4 | Fill #2 | Status: CN

## 2020-09-22 ENCOUNTER — Other Ambulatory Visit (HOSPITAL_COMMUNITY): Payer: Self-pay

## 2020-09-22 ENCOUNTER — Other Ambulatory Visit: Payer: Self-pay | Admitting: Pharmacist

## 2020-09-22 ENCOUNTER — Other Ambulatory Visit: Payer: Self-pay | Admitting: Gastroenterology

## 2020-09-22 ENCOUNTER — Other Ambulatory Visit: Payer: Self-pay

## 2020-09-22 MED ORDER — HUMIRA PEN 40 MG/0.8ML ~~LOC~~ PNKT
1.0000 | PEN_INJECTOR | SUBCUTANEOUS | 12 refills | Status: DC
  Filled 2020-09-22: qty 4, 28d supply, fill #0
  Filled 2020-10-14 – 2020-10-15 (×2): qty 4, 28d supply, fill #1
  Filled 2020-11-11: qty 4, 28d supply, fill #2
  Filled 2020-12-10: qty 4, 28d supply, fill #3
  Filled 2021-01-07: qty 4, 28d supply, fill #4
  Filled 2021-02-01: qty 4, 28d supply, fill #5
  Filled 2021-02-22: qty 4, 28d supply, fill #6
  Filled 2021-03-23: qty 4, 28d supply, fill #7
  Filled 2021-04-23: qty 4, 28d supply, fill #8
  Filled 2021-05-17: qty 4, 28d supply, fill #9
  Filled 2021-06-16: qty 4, 28d supply, fill #10
  Filled 2021-07-16: qty 4, 28d supply, fill #11
  Filled 2021-08-17: qty 4, 28d supply, fill #12

## 2020-09-22 MED ORDER — HUMIRA 40 MG/0.8ML ~~LOC~~ PSKT
1.0000 | PREFILLED_SYRINGE | SUBCUTANEOUS | 12 refills | Status: DC
  Filled 2020-09-22 – 2021-04-19 (×2): qty 2, 14d supply, fill #0

## 2020-09-22 MED ORDER — HUMIRA 40 MG/0.8ML ~~LOC~~ PSKT
1.0000 | PREFILLED_SYRINGE | SUBCUTANEOUS | 12 refills | Status: DC
  Filled 2020-09-22 (×2): qty 2, 14d supply, fill #0

## 2020-09-22 MED ORDER — HUMIRA PEN 40 MG/0.8ML ~~LOC~~ PNKT
1.0000 | PEN_INJECTOR | SUBCUTANEOUS | 12 refills | Status: DC
  Filled 2020-09-22: qty 4, fill #0

## 2020-09-22 MED FILL — Adalimumab Auto-injector Kit 40 MG/0.4ML: SUBCUTANEOUS | 28 days supply | Qty: 4 | Fill #2 | Status: CN

## 2020-09-22 MED FILL — Adalimumab Auto-injector Kit 40 MG/0.4ML: SUBCUTANEOUS | 28 days supply | Qty: 2 | Fill #2 | Status: CN

## 2020-09-23 ENCOUNTER — Other Ambulatory Visit (HOSPITAL_COMMUNITY): Payer: Self-pay

## 2020-09-24 ENCOUNTER — Other Ambulatory Visit (HOSPITAL_COMMUNITY): Payer: Self-pay

## 2020-10-02 ENCOUNTER — Telehealth: Payer: Self-pay | Admitting: Gastroenterology

## 2020-10-02 ENCOUNTER — Other Ambulatory Visit (HOSPITAL_COMMUNITY): Payer: Self-pay

## 2020-10-02 NOTE — Telephone Encounter (Signed)
Pt states that Humira that he received this time is non-citrate free and it burns very bad. Pt states that the one that he got last time was citrate-free. He wants to know if it can be changed to citrate free. Pls call him.

## 2020-10-02 NOTE — Telephone Encounter (Signed)
I spoke with the pt and made him aware that his insurance will not cover humira citrate free pen.  To get citrate free his has to have had an allergic reaction to humira with citrate.  The pt has not had an allergic reaction therefore, insurance will not cover.  The pt voiced understanding.

## 2020-10-14 ENCOUNTER — Encounter: Payer: Self-pay | Admitting: Neurology

## 2020-10-14 ENCOUNTER — Ambulatory Visit: Payer: 59 | Admitting: Neurology

## 2020-10-14 ENCOUNTER — Other Ambulatory Visit: Payer: Self-pay

## 2020-10-14 ENCOUNTER — Other Ambulatory Visit (HOSPITAL_COMMUNITY): Payer: Self-pay

## 2020-10-14 VITALS — BP 137/97 | HR 69 | Ht 68.0 in | Wt 228.8 lb

## 2020-10-14 DIAGNOSIS — G4719 Other hypersomnia: Secondary | ICD-10-CM

## 2020-10-14 DIAGNOSIS — R0683 Snoring: Secondary | ICD-10-CM

## 2020-10-14 DIAGNOSIS — G935 Compression of brain: Secondary | ICD-10-CM | POA: Diagnosis not present

## 2020-10-14 DIAGNOSIS — R519 Headache, unspecified: Secondary | ICD-10-CM | POA: Diagnosis not present

## 2020-10-14 DIAGNOSIS — G43709 Chronic migraine without aura, not intractable, without status migrainosus: Secondary | ICD-10-CM | POA: Diagnosis not present

## 2020-10-14 NOTE — Patient Instructions (Addendum)
Recommendations for Migraines:  Prevention: Emgality(once montly injection), Qulipta(once daily pill) and/or Botox injection Emergency/Acute: Stick with imitrex. 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 Brett Fairy)  Sleep Apnea Sleep apnea is a condition in which breathing pauses or becomes shallow during sleep. People with sleep apnea usually snore loudly. They may have times when they gasp and stop breathing for 10 seconds or more during sleep. This mayhappen many times during the night. Sleep apnea disrupts your sleep and keeps your body from getting the rest that it needs. This condition can increase your risk of certain health problems, including: Heart attack. Stroke. Obesity. Type 2 diabetes. Heart failure. Irregular heartbeat. High blood pressure. The goal of treatment is to help you breathe normally again. What are the causes? The most common cause of sleep apnea is a collapsed or blocked airway. There are three kinds of sleep apnea: Obstructive sleep apnea. This kind is caused by a blocked or collapsed airway. Central sleep apnea. This kind happens when the part of the brain that controls breathing does not send the correct signals to the muscles that control breathing. Mixed sleep apnea. This is a combination of obstructive and central sleep apnea. What increases the risk? You are more likely to develop this condition if you: Are overweight. Smoke. Have a smaller than normal airway. Are older. Are male. Drink alcohol. Take sedatives or tranquilizers. Have a family history of sleep apnea. Have a tongue or tonsils that are larger than normal. What are the signs or symptoms? Symptoms of this condition include: Trouble staying asleep. Loud snoring. Morning headaches. Waking up gasping. Dry mouth or sore throat in the  morning. Daytime sleepiness and tiredness. If you have daytime fatigue because of sleep apnea, you may be more likely to have: Trouble concentrating. Forgetfulness. Irritability or mood swings. Personality changes. Feelings of depression. Sexual dysfunction. This may include loss of interest if you are male, or erectile dysfunction if you are male. How is this diagnosed? This condition may be diagnosed with: A medical history. A physical exam. A series of tests that are done while you are sleeping (sleep study). These tests are usually done in a sleep lab, but they may also be done at home. How is this treated? Treatment for this condition aims to restore normal breathing and to ease symptoms during sleep. It may involve managing health issues that can affect breathing, such as high blood pressure or obesity. Treatment may include: Sleeping on your side. Using a decongestant if you have nasal congestion. Avoiding the use of depressants, including alcohol, sedatives, and narcotics. Losing weight if you are overweight. Making changes to your diet. Quitting smoking. Using a device to open your airway while you sleep, such as: An oral appliance. This is a custom-made mouthpiece that shifts your lower jaw forward. A continuous positive airway pressure (CPAP) device. This device blows air through a mask when you breathe out (exhale). A nasal expiratory positive airway pressure (EPAP) device. This device has valves that you put into each nostril. A bi-level positive airway pressure (BPAP) device. This device blows air through a mask when you breathe in (inhale) and breathe out (exhale). Having surgery if other treatments do not work. During surgery, excess tissue is removed to create a wider airway. Follow these instructions at home: Lifestyle Make any lifestyle changes that your health care provider recommends. Eat a healthy, well-balanced diet. Take  steps to lose weight if you are  overweight. Avoid using depressants, including alcohol, sedatives, and narcotics. Do not use any products that contain nicotine or tobacco. These products include cigarettes, chewing tobacco, and vaping devices, such as e-cigarettes. If you need help quitting, ask your health care provider. General instructions Take over-the-counter and prescription medicines only as told by your health care provider. If you were given a device to open your airway while you sleep, use it only as told by your health care provider. If you are having surgery, make sure to tell your health care provider you have sleep apnea. You may need to bring your device with you. Keep all follow-up visits. This is important. Contact a health care provider if: The device that you received to open your airway during sleep is uncomfortable or does not seem to be working. Your symptoms do not improve. Your symptoms get worse. Get help right away if: You develop: Chest pain. Shortness of breath. Discomfort in your back, arms, or stomach. You have: Trouble speaking. Weakness on one side of your body. Drooping in your face. These symptoms may represent a serious problem that is an emergency. Do not wait to see if the symptoms will go away. Get medical help right away. Call your local emergency services (911 in the [place] Do not drive yourself to the hospital. Summary Sleep apnea is a condition in which breathing pauses or becomes shallow during sleep. The most common cause is a collapsed or blocked airway. The goal of treatment is to restore normal breathing and to ease symptoms during sleep. This information is not intended to replace advice given to you by your health care provider. Make sure you discuss any questions you have with your healthcare provider. Document Revised: 02/14/2020 Document Reviewed: 02/14/2020 Elsevier Patient Education  2022 [place]. Chiari Malformation  Chiari malformation (CM) is a type of  brain abnormality that affects the parts of the brain called the cerebellum and the brain stem. The cerebellum is important for balance, and the brain stem is important for basic bodyfunctions, such as breathing and swallowing. Normally, the cerebellum is located in a space at the back of the skull, just above the opening in the skull (foramen magnum)where the spinal cord meets the brain stem. With CM, part of the cerebellum is located below the foramen magnum instead. The malformation can be mild with no or few symptoms, or it can be severe. CM can cause neck pain, headaches,balance problems, and other symptoms. What are the causes? CM is a condition that a person is born with (congenital). In rare cases, CM may also develop later in life, which is called acquired CM or secondary CM. These cases may be caused by a leak of the fluid that surrounds and protects the brain and spinal cord (cerebrospinal fluid), leading to low pressure. In acquired or secondary CM, abnormal pressure develops in the brain. Thispushes the cerebellum down into the foramen magnum. What increases the risk? The following factors may make you more likely to develop this condition: Being male. Having a family history of CM. What are the signs or symptoms? Symptoms of this condition may vary depending on the severity of your CM. In some cases, there are no symptoms. In other cases, symptoms may come and go. The most common symptom is a severe headache in the back of the head. The headache: May come and go. May spread to your neck and shoulders. May be worse when you cough, sneeze, or strain.  Other symptoms include: Difficulty balancing. Loss of coordination. Vision problems, such as double vision, tiny spots moving across your vision (floaters), or sensitivity to lights. Trouble swallowing or speaking. Muscle weakness. Feeling dizzy or fainting. Ringing in the ears. Tiredness (fatigue). Tingling or burning sensations in  the fingers or toes. Hearing problems. Vomiting. Inability to control when you urinate (incontinence). How is this diagnosed? This condition may be evaluated with a medical history and physical exam. This may include tests to check your balance and nerves (neurological exam). You may also have imaging tests, such as a CT scan or an MRI. How is this treated? Treatment for this condition depends on the severity of your symptoms. You may be treated with: Surgery to prevent the malformation from getting worse, or to treat severe symptoms or symptoms that are getting worse. Medicines or alternative treatments to relieve headaches or neck pain. If you do not have symptoms, you may not need treatment. Follow these instructions at home: Medicines Take over-the-counter and prescription medicines only as told by your health care provider. Ask your health care provider if the medicine prescribed to you requires you to avoid driving or using machinery. If you are taking blood pressure or heart medicine, get up slowly and take several minutes to sit and then stand. This can reduce dizziness. General instructions If you feel like you might faint: Lie down right away and raise (elevate) your feet above the level of your heart. Breathe deeply and steadily. Wait until all of the symptoms have passed. Ask your health care provider which activities are safe for you and if you have any activity restrictions. Do not use any products that contain nicotine or tobacco. These products include cigarettes, chewing tobacco, and vaping devices, such as e-cigarettes. If you need help quitting, ask your health care provider. Drink enough fluid to keep your urine pale yellow. Consider joining a CM support group. Keep all follow-up visits. This is important. Where to find more information [employer] of Neurological Disorders and Stroke: [employer] Contact a health care provider if: You have new  symptoms. Your symptoms get worse. Get help right away if: You develop weakness or numbness in one or all of your limbs. You develop dizziness, slurred speech, double vision, weakness, or numbness with a severe headache. Summary A Chiari malformation is a condition in which part of the cerebellum moves down through the foramen magnum. The malformation can be mild with no symptoms, or it can be severe. In some cases, no treatment is needed. In others, medicines are used to treat headaches. Surgery is done in the worst of cases. This information is not intended to replace advice given to you by your health care provider. Make sure you discuss any questions you have with your healthcare provider. Document Revised: 11/20/2019 Document Reviewed: 11/20/2019 Elsevier Patient Education  2022 [place]. Migraine Headache A migraine headache is an intense, throbbing pain on one side or both sides of the head. Migraine headaches may also cause other symptoms, such as nausea, vomiting, and sensitivity to light and noise. A migraine headache can last from 4 hours to 3 days. Talk with your doctor about what things may bring on (trigger) your migraine headaches. What are the causes? The exact cause of this condition is not known. However, a migraine may be caused when nerves in the brain become irritated and release chemicals that cause inflammation of blood vessels. This inflammation causes pain. This condition may be triggered or caused by: Drinking alcohol. Smoking.  Taking medicines, such as: Medicine used to treat chest pain (nitroglycerin). Birth control pills. Estrogen. Certain blood pressure medicines. Eating or drinking products that contain nitrates, glutamate, aspartame, or tyramine. Aged cheeses, chocolate, or caffeine may also be triggers. Doing physical activity. Other things that may trigger a migraine headache include: Menstruation. Pregnancy. Hunger. Stress. Lack of sleep or too  much sleep. Weather changes. Fatigue. What increases the risk? The following factors may make you more likely to experience migraine headaches: Being a certain age. This condition is more common in people who are 74-70 years old. Being male. Having a family history of migraine headaches. Being Caucasian. Having a mental health condition, such as depression or anxiety. Being obese. What are the signs or symptoms? The main symptom of this condition is pulsating or throbbing pain. This pain may: Happen in any area of the head, such as on one side or both sides. Interfere with daily activities. Get worse with physical activity. Get worse with exposure to bright lights or loud noises. Other symptoms may include: Nausea. Vomiting. Dizziness. General sensitivity to bright lights, loud noises, or smells. Before you get a migraine headache, you may get warning signs (an aura). An aura may include: Seeing flashing lights or having blind spots. Seeing bright spots, halos, or zigzag lines. Having tunnel vision or blurred vision. Having numbness or a tingling feeling. Having trouble talking. Having muscle weakness. Some people have symptoms after a migraine headache (postdromal phase), such as: Feeling tired. Difficulty concentrating. How is this diagnosed? A migraine headache can be diagnosed based on: Your symptoms. A physical exam. Tests, such as: CT scan or an MRI of the head. These imaging tests can help rule out other causes of headaches. Taking fluid from the spine (lumbar puncture) and analyzing it (cerebrospinal fluid analysis, or CSF analysis). How is this treated? This condition may be treated with medicines that: Relieve pain. Relieve nausea. Prevent migraine headaches. Treatment for this condition may also include: Acupuncture. Lifestyle changes like avoiding foods that trigger migraine headaches. Biofeedback. Cognitive behavioral therapy. Follow these instructions  at home: Medicines Take over-the-counter and prescription medicines only as told by your health care provider. Ask your health care provider if the medicine prescribed to you: Requires you to avoid driving or using heavy machinery. Can cause constipation. You may need to take these actions to prevent or treat constipation: Drink enough fluid to keep your urine pale yellow. Take over-the-counter or prescription medicines. Eat foods that are high in fiber, such as beans, whole grains, and fresh fruits and vegetables. Limit foods that are high in fat and processed sugars, such as fried or sweet foods. Lifestyle Do not drink alcohol. Do not use any products that contain nicotine or tobacco, such as cigarettes, e-cigarettes, and chewing tobacco. If you need help quitting, ask your health care provider. Get at least 8 hours of sleep every night. Find ways to manage stress, such as meditation, deep breathing, or yoga. General instructions     Keep a journal to find out what may trigger your migraine headaches. For example, write down: What you eat and drink. How much sleep you get. Any change to your diet or medicines. If you have a migraine headache: Avoid things that make your symptoms worse, such as bright lights. It may help to lie down in a dark, quiet room. Do not drive or use heavy machinery. Ask your health care provider what activities are safe for you while you are experiencing symptoms. Keep all follow-up visits  as told by your health care provider. This is important. Contact a health care provider if: You develop symptoms that are different or more severe than your usual migraine headache symptoms. You have more than 15 headache days in one month. Get help right away if: Your migraine headache becomes severe. Your migraine headache lasts longer than 72 hours. You have a fever. You have a stiff neck. You have vision loss. Your muscles feel weak or like you cannot control  them. You start to lose your balance often. You have trouble walking. You faint. You have a seizure. Summary A migraine headache is an intense, throbbing pain on one side or both sides of the head. Migraines may also cause other symptoms, such as nausea, vomiting, and sensitivity to light and noise. This condition may be treated with medicines and lifestyle changes. You may also need to avoid certain things that trigger a migraine headache. Keep a journal to find out what may trigger your migraine headaches. Contact your health care provider if you have more than 15 headache days in a month or you develop symptoms that are different or more severe than your usual migraine headache symptoms. This information is not intended to replace advice given to you by your health care provider. Make sure you discuss any questions you have with your healthcare provider. Document Revised: 06/29/2018 Document Reviewed: 04/19/2018 Elsevier Patient Education  2022 [place] [employer] What is this medication? GALCANEZUMAB (gal ka NEZ ue mab) is used to prevent migraines and treat clusterheadaches. This medicine may be used for other purposes; ask your health care provider orpharmacist if you have questions. COMMON BRAND NAME(S): Emgality What should I tell my care team before I take this medication? They need to know if you have any of these conditions: an unusual or allergic reaction to galcanezumab, other medicines, foods, dyes, or preservatives pregnant or trying to get pregnant breast-feeding How should I use this medication? This medicine is for injection under the skin. You will be taught how to prepare and give this medicine. Use exactly as directed. Take your medicine atregular intervals. Do not take your medicine more often than directed. It is important that you put your used needles and syringes in a special sharps container. Do not put them in a trash can. If you do not have a  sharpscontainer, call your pharmacist or healthcare provider to get one. Talk to your pediatrician regarding the use of this medicine in children.Special care may be needed. Overdosage: If you think you have taken too much of this medicine contact apoison control center or emergency room at once. NOTE: This medicine is only for you. Do not share this medicine with others. What if I miss a dose? If you miss a dose, take it as soon as you can. If it is almost time for yournext dose, take only that dose. Do not take double or extra doses. What may interact with this medication? Interactions are not expected. This list may not describe all possible interactions. Give your health care provider a list of all the medicines, herbs, non-prescription drugs, or dietary supplements you use. Also tell them if you smoke, drink alcohol, or use illegaldrugs. Some items may interact with your medicine. What should I watch for while using this medication? Tell your doctor or healthcare professional if your symptoms do not start toget better or if they get worse. What side effects may I notice from receiving this medication? Side effects that you should report to  your doctor or health care professionalas soon as possible: allergic reactions like skin rash, itching or hives, swelling of the face, lips, or tongue Side effects that usually do not require medical attention (report these toyour doctor or health care professional if they continue or are bothersome): pain, redness, or irritation at site where injected This list may not describe all possible side effects. Call your doctor for medical advice about side effects. You may report side effects to FDA at1-800-FDA-1088. Where should I keep my medication? Keep out of the reach of children. You will be instructed on how to store this medicine. Throw away any unusedmedicine after the expiration date on the label. NOTE: This sheet is a summary. It may not cover all  possible information. If you have questions about this medicine, talk to your doctor, pharmacist, orhealth care provider.  2022 Elsevier/[employer] Standard (2017-08-23 12:03:23)
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
## 2020-10-15 ENCOUNTER — Other Ambulatory Visit (HOSPITAL_COMMUNITY): Payer: Self-pay

## 2020-10-16 ENCOUNTER — Other Ambulatory Visit (HOSPITAL_COMMUNITY): Payer: Self-pay

## 2020-10-26 ENCOUNTER — Other Ambulatory Visit (HOSPITAL_COMMUNITY): Payer: Self-pay

## 2020-11-10 ENCOUNTER — Other Ambulatory Visit (HOSPITAL_COMMUNITY): Payer: Self-pay

## 2020-11-11 ENCOUNTER — Other Ambulatory Visit (HOSPITAL_COMMUNITY): Payer: Self-pay

## 2020-11-12 ENCOUNTER — Other Ambulatory Visit (HOSPITAL_COMMUNITY): Payer: Self-pay

## 2020-11-23 ENCOUNTER — Other Ambulatory Visit (HOSPITAL_COMMUNITY): Payer: Self-pay

## 2020-11-24 ENCOUNTER — Other Ambulatory Visit (HOSPITAL_COMMUNITY): Payer: Self-pay

## 2020-12-07 ENCOUNTER — Encounter: Payer: Self-pay | Admitting: *Deleted

## 2020-12-07 DIAGNOSIS — Z0289 Encounter for other administrative examinations: Secondary | ICD-10-CM

## 2020-12-09 ENCOUNTER — Telehealth: Payer: Self-pay | Admitting: *Deleted

## 2020-12-09 NOTE — Telephone Encounter (Signed)
FMLA form for migraines completed, signed, and sent to medical records for processing.

## 2020-12-09 NOTE — Telephone Encounter (Signed)
Pt fmla form faxed on 12/09/20

## 2020-12-10 ENCOUNTER — Other Ambulatory Visit (HOSPITAL_COMMUNITY): Payer: Self-pay

## 2020-12-14 ENCOUNTER — Other Ambulatory Visit (HOSPITAL_COMMUNITY): Payer: Self-pay

## 2020-12-22 ENCOUNTER — Other Ambulatory Visit: Payer: Self-pay | Admitting: Internal Medicine

## 2020-12-22 ENCOUNTER — Other Ambulatory Visit (HOSPITAL_COMMUNITY): Payer: Self-pay

## 2020-12-22 MED ORDER — METOPROLOL TARTRATE 50 MG PO TABS
ORAL_TABLET | ORAL | 1 refills | Status: DC
  Filled 2020-12-22: qty 270, 90d supply, fill #0
  Filled 2021-03-22: qty 270, 90d supply, fill #1

## 2020-12-22 MED ORDER — LOSARTAN POTASSIUM 50 MG PO TABS
ORAL_TABLET | ORAL | 1 refills | Status: DC
  Filled 2020-12-22: qty 180, 90d supply, fill #0
  Filled 2021-03-22: qty 180, 90d supply, fill #1

## 2020-12-25 ENCOUNTER — Other Ambulatory Visit: Payer: Self-pay

## 2020-12-25 ENCOUNTER — Ambulatory Visit (INDEPENDENT_AMBULATORY_CARE_PROVIDER_SITE_OTHER): Payer: 59

## 2020-12-25 ENCOUNTER — Encounter: Payer: Self-pay | Admitting: *Deleted

## 2020-12-25 DIAGNOSIS — Z23 Encounter for immunization: Secondary | ICD-10-CM

## 2020-12-31 ENCOUNTER — Institutional Professional Consult (permissible substitution): Payer: 59 | Admitting: Neurology

## 2021-01-07 ENCOUNTER — Other Ambulatory Visit (HOSPITAL_COMMUNITY): Payer: Self-pay

## 2021-01-11 ENCOUNTER — Other Ambulatory Visit (HOSPITAL_COMMUNITY): Payer: Self-pay

## 2021-02-01 ENCOUNTER — Other Ambulatory Visit (HOSPITAL_COMMUNITY): Payer: Self-pay

## 2021-02-03 ENCOUNTER — Other Ambulatory Visit (HOSPITAL_COMMUNITY): Payer: Self-pay

## 2021-02-15 ENCOUNTER — Other Ambulatory Visit: Payer: Self-pay | Admitting: Internal Medicine

## 2021-02-15 ENCOUNTER — Other Ambulatory Visit (HOSPITAL_COMMUNITY): Payer: Self-pay

## 2021-02-15 MED ORDER — AMLODIPINE BESYLATE 5 MG PO TABS
5.0000 mg | ORAL_TABLET | Freq: Every day | ORAL | 1 refills | Status: DC
  Filled 2021-02-15: qty 90, 90d supply, fill #0
  Filled 2021-05-18: qty 90, 90d supply, fill #1

## 2021-02-16 ENCOUNTER — Ambulatory Visit: Payer: 59 | Attending: Family Medicine | Admitting: Pharmacist

## 2021-02-16 ENCOUNTER — Other Ambulatory Visit: Payer: Self-pay

## 2021-02-16 DIAGNOSIS — Z79899 Other long term (current) drug therapy: Secondary | ICD-10-CM

## 2021-02-16 NOTE — Progress Notes (Signed)
   S: Patient presents  for review of their specialty medication therapy.  Patient is currently taking Humira for ulcerative colitis. Patient is managed by Dr. Ardis Hughs for this.   Adherence: confirms  Efficacy: he continues to do well on Humira with good results   Dosing:  Ulcerative colitis: 40 mg every week.  Dose adjustments: Renal: no dose adjustments (has not been studied) Hepatic: no dose adjustments (has not been studied)  Screening: TB test: negative Hepatitis: negative  Monitoring: S/sx of injection site reactions: denies  S/sx of infection: denies CBC: see below S/sx of hypersensitivity: denies S/sx of malignancy: denies S/sx of heart failure: denies  O:  Lab Results  Component Value Date   WBC 9.3 04/20/2020   HGB 15.2 04/20/2020   HCT 44.5 04/20/2020   MCV 100.9 (H) 04/20/2020   PLT 355.0 04/20/2020      Chemistry      Component Value Date/Time   NA 141 04/20/2020 0834   K 3.3 (L) 04/20/2020 0834   CL 104 04/20/2020 0834   CO2 28 04/20/2020 0834   BUN 12 04/20/2020 0834   CREATININE 1.16 04/20/2020 0834   CREATININE 1.33 01/03/2020 1608      Component Value Date/Time   CALCIUM 9.2 04/20/2020 0834   ALKPHOS 77 04/20/2020 0834   AST 23 04/20/2020 0834   ALT 36 04/20/2020 0834   BILITOT 1.4 (H) 04/20/2020 0834      A/P: 1. Medication review: Patient currently on Humira for ulcerative colitis. Reviewed the medication with the patient, including the following: Humira is a TNF blocking agent indicated for ankylosing spondylitis, Crohn's disease, Hidradenitis suppurativa, psoriatic arthritis, plaque psoriasis, ulcerative colitis, and uveitis. Patient educated on purpose, proper use and potential adverse effects of Humira. The most common adverse effects are infections, headache, and injection site reactions. There is the possibility of an increased risk of malignancy but it is not well understood if this increased risk is due to there medication or the  disease state. There are rare cases of pancytopenia and aplastic anemia. No recommendations for any changes at this time.   Benard Halsted, PharmD, [place], [place] [phone]

## 2021-02-17 ENCOUNTER — Ambulatory Visit (INDEPENDENT_AMBULATORY_CARE_PROVIDER_SITE_OTHER): Payer: 59

## 2021-02-17 ENCOUNTER — Other Ambulatory Visit: Payer: Self-pay

## 2021-02-17 ENCOUNTER — Ambulatory Visit (HOSPITAL_COMMUNITY)
Admission: EM | Admit: 2021-02-17 | Discharge: 2021-02-17 | Disposition: A | Discharge status: Home / Self Care | Payer: 59 | Attending: Urgent Care | Admitting: Urgent Care

## 2021-02-17 ENCOUNTER — Encounter (HOSPITAL_COMMUNITY): Payer: Self-pay | Admitting: Emergency Medicine

## 2021-02-17 ENCOUNTER — Other Ambulatory Visit (HOSPITAL_COMMUNITY): Payer: Self-pay

## 2021-02-17 ENCOUNTER — Telehealth: Payer: Self-pay | Admitting: Internal Medicine

## 2021-02-17 DIAGNOSIS — R0989 Other specified symptoms and signs involving the circulatory and respiratory systems: Secondary | ICD-10-CM

## 2021-02-17 DIAGNOSIS — K51919 Ulcerative colitis, unspecified with unspecified complications: Secondary | ICD-10-CM | POA: Insufficient documentation

## 2021-02-17 DIAGNOSIS — Z79899 Other long term (current) drug therapy: Secondary | ICD-10-CM | POA: Insufficient documentation

## 2021-02-17 DIAGNOSIS — I10 Essential (primary) hypertension: Secondary | ICD-10-CM | POA: Insufficient documentation

## 2021-02-17 DIAGNOSIS — R0789 Other chest pain: Secondary | ICD-10-CM | POA: Insufficient documentation

## 2021-02-17 DIAGNOSIS — R059 Cough, unspecified: Secondary | ICD-10-CM | POA: Diagnosis not present

## 2021-02-17 DIAGNOSIS — U071 COVID-19: Secondary | ICD-10-CM | POA: Diagnosis not present

## 2021-02-17 DIAGNOSIS — B349 Viral infection, unspecified: Secondary | ICD-10-CM | POA: Diagnosis not present

## 2021-02-17 DIAGNOSIS — R051 Acute cough: Secondary | ICD-10-CM | POA: Diagnosis not present

## 2021-02-17 DIAGNOSIS — J4530 Mild persistent asthma, uncomplicated: Secondary | ICD-10-CM | POA: Diagnosis not present

## 2021-02-17 LAB — RESPIRATORY PANEL BY PCR

## 2021-02-17 MED ORDER — PREDNISONE 20 MG PO TABS
ORAL_TABLET | ORAL | 0 refills | Status: DC
  Filled 2021-02-17: qty 10, 5d supply, fill #0

## 2021-02-17 MED ORDER — BENZONATATE 100 MG PO CAPS
100.0000 mg | ORAL_CAPSULE | Freq: Three times a day (TID) | ORAL | 0 refills | Status: DC | PRN
  Filled 2021-02-17: qty 60, 10d supply, fill #0

## 2021-02-17 MED ORDER — PROMETHAZINE-DM 6.25-15 MG/5ML PO SYRP
5.0000 mL | ORAL_SOLUTION | Freq: Every evening | ORAL | 0 refills | Status: DC | PRN
  Filled 2021-02-17: qty 100, 20d supply, fill #0
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
## 2021-02-17 NOTE — ED Triage Notes (Signed)
Pt presents with cough, sob, headaches, and body aches that started yesterday. States had symptoms for 1 week that resolved for 2-3 days before returning.

## 2021-02-17 NOTE — Telephone Encounter (Signed)
Pt. Called in and stated he has been feeling sick for a few days. He started feeling better and then woke up this morning and started back feeling bad. He stated he is having issues breathing. Transferred to triage for further advice.

## 2021-02-17 NOTE — Discharge Instructions (Addendum)
We will notify you of your test results as they arrive and may take between 48-72 hours.  I encourage you to sign up for MyChart if you have not already done so as this can be the easiest way for [place] to communicate results to you online or through a phone app.  Generally, we only contact you if it is a positive test result.  In the meantime, if you develop worsening symptoms including fever, chest pain, shortness of breath despite our current treatment plan then please report to the emergency room as this may be a sign of worsening status from possible viral infection.  Otherwise, we will manage this as a viral syndrome. For sore throat or cough try using a honey-based tea. Use 3 teaspoons of honey with juice squeezed from half lemon. Place shaved pieces of ginger into 1/2-1 cup of water and warm over stove top. Then mix the ingredients and repeat every 4 hours as needed. Please take Tylenol 589m-650mg every 6 hours for aches and pains, fevers. Hydrate very well with at least 2 liters of water. Eat light meals such as soups to replenish electrolytes and soft fruits, veggies. Start an antihistamine like Zyrtec for postnasal drainage, sinus congestion.

## 2021-02-17 NOTE — Telephone Encounter (Signed)
Nurse Assessment Nurse: Altamease Oiler, RN, Adriana Date/Time (Eastern Time): 02/17/2021 8:35:30 AM Confirm and document reason for call. If symptomatic, describe symptoms. ---pt states he was not feeling well last week and it seems to be coming back. covid (-) Wed and sunday. cough, congestion, body aches, headaches, "hard time catching breath", sob with exertion. had a fever last week. Does the patient have any new or worsening symptoms? ---Yes Will a triage be completed? ---Yes Related visit to physician within the last 2 weeks? ---No Does the PT have any chronic conditions? (i.e. diabetes, asthma, this includes High risk factors for pregnancy, etc.) ---Yes List chronic conditions. ---UC- migraines htn Is this a behavioral health or substance abuse call? ---No Guidelines Guideline Title Affirmed Question Affirmed Notes Nurse Date/Time ([place] Time) COVID-19 - Diagnosed or Suspected MILD difficulty breathing (e.g., minimal/no SOB at rest, SOB with walking, pulse <100) Altamease Oiler, RN, Adriana 02/17/2021 8:39:34 AM PLEASE NOTE: All timestamps contained within this report are represented as [place] Standard Time. CONFIDENTIALTY NOTICE: This fax transmission is intended only for the addressee. It contains information that is legally privileged, confidential or otherwise protected from use or disclosure. If you are not the intended recipient, you are strictly prohibited from reviewing, disclosing, copying using or disseminating any of this information or taking any action in reliance on or regarding this information. If you have received this fax in error, please notify us immediately by telephone so that we can arrange for its return to [place]. Phone: [phone], Toll-Free: [phone], Fax: [phone] Page: 2 of 2 Call Id: [id number] [place]. Time Eilene Ghazi Time) Disposition Final User 02/17/2021 8:33:55 AM Send to Urgent Queue Peggye Fothergill 02/17/2021 8:51:26 AM See HCP within 4 Hours  (or PCP triage) Yes Altamease Oiler, RN, Fabio Bering Caller Disagree/Comply Comply Caller Understands Yes PreDisposition Call Doctor Care Advice Given Per Guideline SEE HCP (OR PCP TRIAGE) WITHIN 4 HOURS: * IF OFFICE WILL BE OPEN: You need to be seen within the next 3 or 4 hours. Call your doctor (or NP/PA) now or as soon as the office opens. CALL BACK IF: * You become worse CARE ADVICE given per COVID-19 - DIAGNOSED OR SUSPECTED (Adult) guideline. Referrals GO TO FACILITY OTHER - SPECIFY

## 2021-02-18 ENCOUNTER — Other Ambulatory Visit (HOSPITAL_COMMUNITY): Payer: Self-pay

## 2021-02-18 LAB — SARS CORONAVIRUS 2 (TAT 6-24 HRS): SARS Coronavirus 2: POSITIVE — AB

## 2021-02-18 MED ORDER — AZITHROMYCIN 250 MG PO TABS
ORAL_TABLET | ORAL | 0 refills | Status: DC
  Filled 2021-02-18: qty 6, 5d supply, fill #0

## 2021-02-22 ENCOUNTER — Other Ambulatory Visit (HOSPITAL_COMMUNITY): Payer: Self-pay

## 2021-02-25 ENCOUNTER — Other Ambulatory Visit (HOSPITAL_COMMUNITY): Payer: Self-pay

## 2021-03-01 ENCOUNTER — Other Ambulatory Visit (HOSPITAL_COMMUNITY): Payer: Self-pay

## 2021-03-02 ENCOUNTER — Telehealth: Payer: Self-pay | Admitting: Internal Medicine

## 2021-03-02 NOTE — Telephone Encounter (Signed)
FMLA paperwork received.

## 2021-03-02 NOTE — Telephone Encounter (Signed)
[employer] will be sending over information for Dr.Paz to fill out from when pt was out with COVID. Just an [place]

## 2021-03-22 ENCOUNTER — Other Ambulatory Visit (HOSPITAL_COMMUNITY): Payer: Self-pay

## 2021-03-23 ENCOUNTER — Other Ambulatory Visit (HOSPITAL_COMMUNITY): Payer: Self-pay

## 2021-03-25 ENCOUNTER — Other Ambulatory Visit (HOSPITAL_COMMUNITY): Payer: Self-pay

## 2021-03-29 ENCOUNTER — Other Ambulatory Visit (HOSPITAL_COMMUNITY): Payer: Self-pay

## 2021-04-05 ENCOUNTER — Other Ambulatory Visit: Payer: Self-pay | Admitting: Internal Medicine

## 2021-04-05 ENCOUNTER — Other Ambulatory Visit (HOSPITAL_COMMUNITY): Payer: Self-pay

## 2021-04-05 MED ORDER — SUMATRIPTAN SUCCINATE 50 MG PO TABS
ORAL_TABLET | ORAL | 1 refills | Status: AC
  Filled 2021-04-05: qty 30, 7d supply, fill #0

## 2021-04-19 ENCOUNTER — Other Ambulatory Visit (HOSPITAL_COMMUNITY): Payer: Self-pay

## 2021-04-23 ENCOUNTER — Other Ambulatory Visit (HOSPITAL_COMMUNITY): Payer: Self-pay

## 2021-04-28 ENCOUNTER — Encounter: Payer: 59 | Admitting: Internal Medicine

## 2021-05-04 ENCOUNTER — Other Ambulatory Visit (HOSPITAL_COMMUNITY): Payer: Self-pay

## 2021-05-07 ENCOUNTER — Other Ambulatory Visit (HOSPITAL_COMMUNITY): Payer: Self-pay

## 2021-05-13 ENCOUNTER — Encounter: Payer: Self-pay | Admitting: Neurology

## 2021-05-13 ENCOUNTER — Ambulatory Visit (INDEPENDENT_AMBULATORY_CARE_PROVIDER_SITE_OTHER): Payer: 59 | Admitting: Internal Medicine

## 2021-05-13 ENCOUNTER — Ambulatory Visit: Payer: 59 | Admitting: Neurology

## 2021-05-13 ENCOUNTER — Encounter: Payer: Self-pay | Admitting: Internal Medicine

## 2021-05-13 VITALS — BP 136/82 | HR 67 | Temp 98.4°F | Resp 18 | Ht 68.0 in | Wt 229.0 lb

## 2021-05-13 VITALS — BP 153/94 | HR 65 | Ht 68.0 in | Wt 226.5 lb

## 2021-05-13 DIAGNOSIS — R519 Headache, unspecified: Secondary | ICD-10-CM | POA: Diagnosis not present

## 2021-05-13 DIAGNOSIS — I10 Essential (primary) hypertension: Secondary | ICD-10-CM

## 2021-05-13 DIAGNOSIS — K51911 Ulcerative colitis, unspecified with rectal bleeding: Secondary | ICD-10-CM

## 2021-05-13 DIAGNOSIS — R0683 Snoring: Secondary | ICD-10-CM | POA: Diagnosis not present

## 2021-05-13 DIAGNOSIS — Z9189 Other specified personal risk factors, not elsewhere classified: Secondary | ICD-10-CM

## 2021-05-13 DIAGNOSIS — G43709 Chronic migraine without aura, not intractable, without status migrainosus: Secondary | ICD-10-CM | POA: Diagnosis not present

## 2021-05-13 DIAGNOSIS — E669 Obesity, unspecified: Secondary | ICD-10-CM | POA: Diagnosis not present

## 2021-05-13 DIAGNOSIS — G4719 Other hypersomnia: Secondary | ICD-10-CM | POA: Diagnosis not present

## 2021-05-13 DIAGNOSIS — R739 Hyperglycemia, unspecified: Secondary | ICD-10-CM | POA: Diagnosis not present

## 2021-05-13 DIAGNOSIS — E66811 Obesity, class 1: Secondary | ICD-10-CM | POA: Insufficient documentation

## 2021-05-13 DIAGNOSIS — Z0000 Encounter for general adult medical examination without abnormal findings: Secondary | ICD-10-CM

## 2021-05-13 LAB — COMPREHENSIVE METABOLIC PANEL
ALT: 12 U/L (ref 0–53)
AST: 14 U/L (ref 0–37)
Albumin: 4.3 g/dL (ref 3.5–5.2)
Alkaline Phosphatase: 72 U/L (ref 39–117)
BUN: 12 mg/dL (ref 6–23)
CO2: 36 mEq/L — ABNORMAL HIGH (ref 19–32)
Calcium: 9.2 mg/dL (ref 8.4–10.5)
Chloride: 104 mEq/L (ref 96–112)
Creatinine, Ser: 1.28 mg/dL (ref 0.40–1.50)
GFR: 67.26 mL/min (ref >60.00)
Glucose, Bld: 79 mg/dL (ref 70–99)
Potassium: 4.1 mEq/L (ref 3.5–5.1)
Sodium: 142 mEq/L (ref 135–145)
Total Bilirubin: 0.8 mg/dL (ref 0.2–1.2)
Total Protein: 7 g/dL (ref 6.0–8.3)

## 2021-05-13 LAB — LIPID PANEL
Cholesterol: 190 mg/dL (ref 0–200)
HDL: 53 mg/dL (ref >39.00)
LDL Cholesterol: 120 mg/dL — ABNORMAL HIGH (ref 0–99)
NonHDL: 137.16
Total CHOL/HDL Ratio: 4
Triglycerides: 87 mg/dL (ref 0.0–149.0)
VLDL: 17.4 mg/dL (ref 0.0–40.0)

## 2021-05-13 LAB — TSH: TSH: 0.95 u[IU]/mL (ref 0.35–5.50)

## 2021-05-13 LAB — CBC WITH DIFFERENTIAL/PLATELET
Basophils Absolute: 0.1 10*3/uL (ref 0.0–0.1)
Basophils Relative: 1.2 % (ref 0.0–3.0)
Eosinophils Absolute: 0.1 10*3/uL (ref 0.0–0.7)
Eosinophils Relative: 1.6 % (ref 0.0–5.0)
HCT: 44.5 % (ref 39.0–52.0)
Hemoglobin: 14.9 g/dL (ref 13.0–17.0)
Lymphocytes Relative: 18.7 % (ref 12.0–46.0)
Lymphs Abs: 1.2 10*3/uL (ref 0.7–4.0)
MCHC: 33.5 g/dL (ref 30.0–36.0)
MCV: 100.7 fl — ABNORMAL HIGH (ref 78.0–100.0)
Monocytes Absolute: 0.5 10*3/uL (ref 0.1–1.0)
Monocytes Relative: 8.4 % (ref 3.0–12.0)
Neutro Abs: 4.5 10*3/uL (ref 1.4–7.7)
Neutrophils Relative %: 70.1 % (ref 43.0–77.0)
Platelets: 293 10*3/uL (ref 150.0–400.0)
RBC: 4.42 Mil/uL (ref 4.22–5.81)
RDW: 15.5 % (ref 11.5–15.5)
WBC: 6.4 10*3/uL (ref 4.0–10.5)

## 2021-05-13 LAB — HEMOGLOBIN A1C: Hgb A1c MFr Bld: 5.9 % (ref 4.6–6.5)

## 2021-05-13 NOTE — Patient Instructions (Signed)
Check the  blood pressure regularly BP GOAL is between 110/65 and  135/85. If it is consistently higher or lower, let me know    GO TO THE LAB : Get the blood work     [place], [place] back for   a physical exam in 1 year
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
## 2021-05-13 NOTE — Telephone Encounter (Signed)
Form shredded.

## 2021-05-13 NOTE — Patient Instructions (Signed)
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
## 2021-05-14 ENCOUNTER — Encounter: Payer: Self-pay | Admitting: Internal Medicine

## 2021-05-14 NOTE — Assessment & Plan Note (Signed)
Here for CPX Hyperglycemia: Check A1c.  Diet and exercise discussed. HTN:BP today is very good.  Continue amlodipine, losartan, metoprolol.  Labs. Headaches: Currently well controlled on Imitrex. Ulcerative colitis: On Humira, Imuran.  Reports no recent GI symptoms. OSA?:  Was referred by  Dr Jaynee Eagles to Dr Dohmeier to r/o OSA.  To be seen today Stress: Had a lot of stress in the last year, symptoms are okay now.  Managing normal stress well. RTC 1 year

## 2021-05-14 NOTE — Assessment & Plan Note (Signed)
-  Td 2010 and ~ 2019 (@ work) -prevnar 11/2016 - pnm 23  08/18/2017 (w/ pnm 23 booster in 5 years) - COVID vax booster rec, benefits>risks, declines -Had a flu shot - CCS: had a cscope 2019, next per GI, -Labs: CMP, FLP, CBC, A1c, TSH -Diet and exercise discussed

## 2021-05-17 ENCOUNTER — Other Ambulatory Visit (HOSPITAL_COMMUNITY): Payer: Self-pay

## 2021-05-18 ENCOUNTER — Encounter: Payer: Self-pay | Admitting: Neurology

## 2021-05-18 ENCOUNTER — Other Ambulatory Visit (HOSPITAL_COMMUNITY): Payer: Self-pay

## 2021-05-20 ENCOUNTER — Other Ambulatory Visit (HOSPITAL_COMMUNITY): Payer: Self-pay

## 2021-05-27 DIAGNOSIS — Z0289 Encounter for other administrative examinations: Secondary | ICD-10-CM

## 2021-05-31 ENCOUNTER — Ambulatory Visit (INDEPENDENT_AMBULATORY_CARE_PROVIDER_SITE_OTHER): Payer: 59 | Admitting: Neurology

## 2021-05-31 DIAGNOSIS — R0683 Snoring: Secondary | ICD-10-CM

## 2021-05-31 DIAGNOSIS — G4733 Obstructive sleep apnea (adult) (pediatric): Secondary | ICD-10-CM | POA: Diagnosis not present

## 2021-05-31 DIAGNOSIS — G43709 Chronic migraine without aura, not intractable, without status migrainosus: Secondary | ICD-10-CM

## 2021-05-31 DIAGNOSIS — Z9189 Other specified personal risk factors, not elsewhere classified: Secondary | ICD-10-CM

## 2021-05-31 DIAGNOSIS — G4719 Other hypersomnia: Secondary | ICD-10-CM

## 2021-05-31 DIAGNOSIS — R519 Headache, unspecified: Secondary | ICD-10-CM

## 2021-05-31 DIAGNOSIS — E669 Obesity, unspecified: Secondary | ICD-10-CM

## 2021-06-01 NOTE — Telephone Encounter (Signed)
FMLA form completed, signed, and sent to medical records for processing.  ?

## 2021-06-06 NOTE — Procedures (Signed)
PATIENT'S NAME:  Corey Costa, Corey Costa ?DOB:      [birth year]      ?MR#:    [id number]     ?DATE OF RECORDING: 05/31/2021 ?Primary Health Care provider: Dr Corey November, MD  ?REFERRING M.D.:  Corey Ill, MD ?Study Performed:   Baseline Polysomnogram ?(Low AHI did not qualify for SPLIT night)  ?HISTORY:  I met [name] on 05-13-21, upon a request for a Sleep medicine Consultation by Dr Corey Costa. He is a right-handed Corey Costa gentleman employed in Patient Relations at [employer] with a medical history of childhood Asthma, Hand injury, Headache, chronic migraines, more days of pain of than not. 4-5 days a week! He has sometimes auras, photophobia, phonophobia, migraines lasting 2- 8 hours. Had status migrainosus at times.  ?Also listed are HTN (hypertension) (2012), and Ulcerative colitis ([place]).  ?He contracted COVID 19- 02-2021- before he was boosted, resulting in Postviral Fatigue, temporary loss of smell and taste. [name] gained 40 pounds through Corey pandemic and his snoring and apneas have been reported by his spouse since.  ? ? ?Corey patient endorsed Corey Epworth Sleepiness Scale at 14/24 points.  FSS at 40/63 points  ?Corey patient's weight 226 pounds with a height of 68 (inches), resulting in a BMI of 34.4 kg/m2. ?Corey patient's neck circumference measured 18 inches. ? ?CURRENT MEDICATIONS: Humira Pen, Norvasc, Imuran, Zyrtec, Cozaar, Lopressor, Imitrex ?  ?PROCEDURE:  This is a multichannel digital polysomnogram utilizing Corey Somnostar 11.2 system.  Electrodes and sensors were applied and monitored per AASM Specifications.   EEG, EOG, Chin and Limb EMG, were sampled at 200 Hz.  ECG, Snore and Nasal Pressure, Thermal Airflow, Respiratory Effort, CPAP Flow and Pressure, Oximetry was sampled at 50 Hz. Digital video and audio were recorded.     ? ?BASELINE STUDY: Lights Out was at 22:20 and Lights On at 05:00.  Total recording time (TRT) was 401 minutes, with a total sleep time (TST) of 349.5 minutes.   Corey  patient's sleep latency was 20.5 minutes.  REM latency was 74.5 minutes.  Corey sleep efficiency was 87.2 %.  ?Corey patient was fitted for a CPAP mask incase he would qualify for SPLIT night protocol. He has facial hair and tried a nasal cradle and a FFM, a medium sized Simplus  by Corey Costa which his attendant preferred.   ?   ?SLEEP ARCHITECTURE: WASO (Wake after sleep onset) was 34 minutes.  There were 39.5 minutes in Stage N1, 180 minutes Stage N2, 43.5 minutes Stage N3 and 86.5 minutes in Stage REM.  Corey percentage of Stage N1 was 11.3%, Stage N2 was 51.5%, Stage N3 was 12.4% and Stage R (REM sleep) was 24.7%. ? ?RESPIRATORY ANALYSIS:  There were a total of 55 respiratory events-There were 55 hypopneas with a hypopnea index of 9.4 /hour.  ?    ?Corey total APNEA/HYPOPNEA INDEX (AHI) was 9.4/hour and Corey total RESPIRATORY DISTURBANCE INDEX was  9.4 /hour.  47 events occurred in REM sleep and 16 events in NREM. Corey REM AHI was 32.6 /hour, versus a non-REM AHI of 1.8. Corey patient spent 0 minutes of total sleep time in Corey supine position and 350 minutes in non-supine. Corey supine AHI was 0.0 versus a non-supine AHI of 9.4. ? ?OXYGEN SATURATION & C02:   ?Corey Wake baseline 02 saturation was 97%, with Corey lowest being 86%.  ?Time spent below 89% saturation equaled 3 minutes. ? ?PERIODIC LIMB MOVEMENTS:   ?Corey patient had a total of  15 Periodic Limb Movements but Corey Periodic Limb Movement (PLM Arousal index) was 0/hour. ?Corey arousals were noted as: 61 were spontaneous, 0 were associated with PLMs, only 6 were associated with respiratory events. ?Audio and video analysis did not show any abnormal or unusual movements, behaviors, phonations or vocalizations.  EKG was in keeping with normal sinus rhythm (NSR). ? ?IMPRESSION: ? ?Mild Obstructive Sleep Apnea (OSA), clustered in REM sleep in form of hypopnea with brief oxygen desaturations.  ?This is a mild but REM sleep dependent OS-Apnea form.  ?Moderate Snoring and  isolated coughing also added to Corey number of arousals. Corey patient slept mostly on his right side, on 2 pillows. ?There was no significant apnea outside of REM sleep noted.  ?Many spontaneous arousals were noted after 2.15 AM and continued throughout NREM sleep until 3.30 AM. I cannot correlate any physiological changes to these arousals.  ? ?RECOMMENDATIONS: ? ?I recommend return for an attended CPAP titration study to optimize therapy and help to eliminate snoring and review if any bruxism is present. REM dependent apnea/ hypopnea will not respond as well to inspire or dental devices. Apnea treatment may improve sleep related migraine and cluster headaches.  ?Mid- long term apnea treatment should favor a conservative approach:   Weight loss will likely help to reduce this type of apnea as it unburdens Corey abdominal weight and allows Corey diaphragm to be more effective during physiological REM sleep inertia.   ? ? ?I certify that I have reviewed Corey entire raw data recording prior to Corey issuance of this report in accordance with Corey Standards of Accreditation of Corey [place] [employer] of Sleep Medicine (AASM) ? ?Larey Seat, MD ?[place], [place] Board of Sleep Medicine and Neurology  ?Diplomat, Tax adviser of Sleep Medicine ?Medical Director, [facility] [facility] at [facility] ? ? ? ? ?

## 2021-06-06 NOTE — Addendum Note (Signed)
Addended by: Larey Seat on: 06/06/2021 02:51 PM ? ? Modules accepted: Orders ? ?

## 2021-06-06 NOTE — Progress Notes (Signed)
There is mild overall OSA noted, severe only during REM sleep. This is REM sleep dependent sleep hypopnea. Moderate snoring is present.  ?Unexplained sleep fragmentation during NREM< sleep in early morning hours, while REM sleep was sustained and at almost 25% proportion.  ? ?1. I recommend return for an attended CPAP titration study to optimize therapy and help to eliminate snoring and review if any bruxism is present. REM dependent apnea/ hypopnea will not respond as well to inspire or dental devices. Apnea treatment may improve sleep related migraine and cluster headaches.  ?2. Mid- long term apnea treatment should favor a conservative approach:   Weight loss will likely help to reduce this type of apnea as it unburdens the abdominal weight and allows the diaphragm to be more effective during physiological REM sleep inertia.   ??

## 2021-06-07 ENCOUNTER — Telehealth: Payer: Self-pay | Admitting: *Deleted

## 2021-06-07 ENCOUNTER — Other Ambulatory Visit (HOSPITAL_COMMUNITY): Payer: Self-pay

## 2021-06-07 NOTE — Telephone Encounter (Signed)
-----   Message from Larey Seat, MD sent at 06/06/2021  2:51 PM EDT ----- ?There is mild overall OSA noted, severe only during REM sleep. This is REM sleep dependent sleep hypopnea. Moderate snoring is present.  ?Unexplained sleep fragmentation during NREM< sleep in early morning hours, while REM sleep was sustained and at almost 25% proportion.  ? ?1. I recommend return for an attended CPAP titration study to optimize therapy and help to eliminate snoring and review if any bruxism is present. REM dependent apnea/ hypopnea will not respond as well to inspire or dental devices. Apnea treatment may improve sleep related migraine and cluster headaches.  ?2. Mid- long term apnea treatment should favor a conservative approach:   Weight loss will likely help to reduce this type of apnea as it unburdens the abdominal weight and allows the diaphragm to be more effective during physiological REM sleep inertia.   ?? ?

## 2021-06-07 NOTE — Telephone Encounter (Signed)
Called and spoke with pt about results per Dr. Edwena Felty note. Pt verbalized understanding. He is agreeable to cpap titration study. Aware sleep lab will call to schedule once approved. If it is not approved for some reason, we will be back in touch to discuss next steps. He verbalized understanding.  ?

## 2021-06-11 ENCOUNTER — Other Ambulatory Visit (HOSPITAL_COMMUNITY): Payer: Self-pay

## 2021-06-16 ENCOUNTER — Other Ambulatory Visit (HOSPITAL_COMMUNITY): Payer: Self-pay

## 2021-06-17 ENCOUNTER — Telehealth: Payer: Self-pay

## 2021-06-17 NOTE — Telephone Encounter (Signed)
LVM for pt to call me back to schedule sleep study  

## 2021-06-21 ENCOUNTER — Other Ambulatory Visit (HOSPITAL_COMMUNITY): Payer: Self-pay

## 2021-06-28 ENCOUNTER — Other Ambulatory Visit (HOSPITAL_COMMUNITY): Payer: Self-pay

## 2021-06-28 ENCOUNTER — Other Ambulatory Visit: Payer: Self-pay | Admitting: Internal Medicine

## 2021-06-28 MED ORDER — METOPROLOL TARTRATE 50 MG PO TABS
ORAL_TABLET | ORAL | 1 refills | Status: DC
  Filled 2021-06-28: qty 270, 90d supply, fill #0
  Filled 2021-10-05: qty 270, 90d supply, fill #1

## 2021-06-28 MED ORDER — LOSARTAN POTASSIUM 50 MG PO TABS
ORAL_TABLET | ORAL | 1 refills | Status: DC
  Filled 2021-06-28: qty 180, 90d supply, fill #0
  Filled 2021-10-05: qty 180, 90d supply, fill #1

## 2021-07-05 ENCOUNTER — Other Ambulatory Visit (HOSPITAL_COMMUNITY): Payer: Self-pay

## 2021-07-06 ENCOUNTER — Telehealth: Payer: Self-pay

## 2021-07-06 NOTE — Telephone Encounter (Signed)
LVM to schedule sleep study ?

## 2021-07-12 ENCOUNTER — Other Ambulatory Visit (HOSPITAL_COMMUNITY): Payer: Self-pay

## 2021-07-12 ENCOUNTER — Ambulatory Visit (INDEPENDENT_AMBULATORY_CARE_PROVIDER_SITE_OTHER): Payer: 59 | Admitting: Gastroenterology

## 2021-07-12 ENCOUNTER — Encounter: Payer: Self-pay | Admitting: Gastroenterology

## 2021-07-12 VITALS — BP 136/86 | HR 74 | Ht 68.0 in | Wt 230.5 lb

## 2021-07-12 DIAGNOSIS — K51919 Ulcerative colitis, unspecified with unspecified complications: Secondary | ICD-10-CM | POA: Diagnosis not present
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
## 2021-07-12 NOTE — Patient Instructions (Signed)
If you are age 48 or younger, your body mass index should be between 19-25. Your Body mass index is 35.05 kg/m?Marland Kitchen If this is out of the aformentioned range listed, please consider follow up with your Primary Care Provider.  ?________________________________________________________ ? ?[facility] providers would like to encourage you to use [facility] to communicate with providers for non-urgent requests or questions.  Due to long hold times on the telephone, sending your provider a message by [facility] may be a faster and more efficient way to get a response.  Please allow 48 business hours for a response.  Please remember that this is for non-urgent requests.  ?_______________________________________________________ ? ?You will follow up in our office in 1 year (May 2024).  We will contact you to schedule this appointment. ? ?Thank you for entrusting me with your care and choosing [facility]. ? ?Dr Ardis Hughs ? ?

## 2021-07-14 ENCOUNTER — Other Ambulatory Visit (HOSPITAL_COMMUNITY): Payer: Self-pay

## 2021-07-16 ENCOUNTER — Other Ambulatory Visit (HOSPITAL_COMMUNITY): Payer: Self-pay

## 2021-07-21 ENCOUNTER — Other Ambulatory Visit (HOSPITAL_COMMUNITY): Payer: Self-pay

## 2021-08-11 ENCOUNTER — Other Ambulatory Visit (HOSPITAL_COMMUNITY): Payer: Self-pay

## 2021-08-11 ENCOUNTER — Other Ambulatory Visit: Payer: Self-pay | Admitting: Gastroenterology

## 2021-08-11 MED ORDER — AZATHIOPRINE 50 MG PO TABS
200.0000 mg | ORAL_TABLET | Freq: Every day | ORAL | 11 refills | Status: DC
  Filled 2021-08-11: qty 120, 30d supply, fill #0
  Filled 2021-09-06: qty 120, 30d supply, fill #1
  Filled 2021-10-18: qty 120, 30d supply, fill #2
  Filled 2021-11-23: qty 120, 30d supply, fill #3
  Filled 2021-12-23: qty 120, 30d supply, fill #4
  Filled 2022-01-24: qty 120, 30d supply, fill #5
  Filled 2022-02-22: qty 120, 30d supply, fill #6
  Filled 2022-03-28: qty 120, 30d supply, fill #7
  Filled 2022-05-02: qty 120, 30d supply, fill #8
  Filled 2022-05-30: qty 120, 30d supply, fill #9
  Filled 2022-07-04: qty 120, 30d supply, fill #10
  Filled 2022-08-01: qty 120, 30d supply, fill #11

## 2021-08-17 ENCOUNTER — Other Ambulatory Visit (HOSPITAL_COMMUNITY): Payer: Self-pay

## 2021-08-19 ENCOUNTER — Other Ambulatory Visit (HOSPITAL_COMMUNITY): Payer: Self-pay

## 2021-08-20 ENCOUNTER — Other Ambulatory Visit (HOSPITAL_COMMUNITY): Payer: Self-pay

## 2021-08-23 ENCOUNTER — Other Ambulatory Visit (HOSPITAL_COMMUNITY): Payer: Self-pay

## 2021-08-23 ENCOUNTER — Other Ambulatory Visit: Payer: Self-pay | Admitting: Internal Medicine

## 2021-08-23 MED ORDER — AMLODIPINE BESYLATE 5 MG PO TABS
5.0000 mg | ORAL_TABLET | Freq: Every day | ORAL | 2 refills | Status: DC
  Filled 2021-08-23: qty 90, 90d supply, fill #0
  Filled 2021-12-06: qty 90, 90d supply, fill #1
  Filled 2022-03-15: qty 90, 90d supply, fill #2

## 2021-09-06 ENCOUNTER — Other Ambulatory Visit (HOSPITAL_COMMUNITY): Payer: Self-pay

## 2021-09-09 ENCOUNTER — Other Ambulatory Visit (HOSPITAL_COMMUNITY): Payer: Self-pay

## 2021-09-15 ENCOUNTER — Other Ambulatory Visit (HOSPITAL_COMMUNITY): Payer: Self-pay

## 2021-09-15 ENCOUNTER — Other Ambulatory Visit: Payer: Self-pay | Admitting: Gastroenterology

## 2021-09-16 ENCOUNTER — Other Ambulatory Visit: Payer: Self-pay | Admitting: Gastroenterology

## 2021-09-16 ENCOUNTER — Other Ambulatory Visit (HOSPITAL_COMMUNITY): Payer: Self-pay

## 2021-09-16 MED ORDER — HUMIRA PEN 40 MG/0.8ML ~~LOC~~ PNKT
1.0000 | PEN_INJECTOR | SUBCUTANEOUS | 12 refills | Status: DC
  Filled 2021-09-16: qty 4, 28d supply, fill #0
  Filled 2021-10-08: qty 4, 28d supply, fill #1

## 2021-10-05 ENCOUNTER — Other Ambulatory Visit (HOSPITAL_COMMUNITY): Payer: Self-pay

## 2021-10-08 ENCOUNTER — Other Ambulatory Visit: Payer: Self-pay | Admitting: Pharmacist

## 2021-10-08 ENCOUNTER — Other Ambulatory Visit (HOSPITAL_COMMUNITY): Payer: Self-pay

## 2021-10-08 MED ORDER — HUMIRA PEN 40 MG/0.8ML ~~LOC~~ PNKT
1.0000 | PEN_INJECTOR | SUBCUTANEOUS | 12 refills | Status: DC
  Filled 2021-10-08: qty 4, 28d supply, fill #0
  Filled 2021-11-01: qty 4, 28d supply, fill #1

## 2021-10-11 ENCOUNTER — Other Ambulatory Visit (HOSPITAL_COMMUNITY): Payer: Self-pay

## 2021-10-13 ENCOUNTER — Other Ambulatory Visit (HOSPITAL_COMMUNITY): Payer: Self-pay

## 2021-10-18 ENCOUNTER — Other Ambulatory Visit (HOSPITAL_COMMUNITY): Payer: Self-pay

## 2021-10-19 ENCOUNTER — Other Ambulatory Visit (HOSPITAL_COMMUNITY): Payer: Self-pay

## 2021-10-20 ENCOUNTER — Other Ambulatory Visit (HOSPITAL_COMMUNITY): Payer: Self-pay

## 2021-11-01 ENCOUNTER — Other Ambulatory Visit (HOSPITAL_COMMUNITY): Payer: Self-pay

## 2021-11-08 ENCOUNTER — Other Ambulatory Visit (HOSPITAL_COMMUNITY): Payer: Self-pay

## 2021-11-17 ENCOUNTER — Other Ambulatory Visit: Payer: Self-pay | Admitting: Pharmacist

## 2021-11-17 ENCOUNTER — Telehealth: Payer: Self-pay | Admitting: Gastroenterology

## 2021-11-17 ENCOUNTER — Other Ambulatory Visit (HOSPITAL_COMMUNITY): Payer: Self-pay

## 2021-11-17 MED ORDER — HUMIRA (2 PEN) 40 MG/0.4ML ~~LOC~~ AJKT
1.0000 | AUTO-INJECTOR | SUBCUTANEOUS | 6 refills | Status: DC
  Filled 2021-11-17: qty 4, fill #0

## 2021-11-17 MED ORDER — HUMIRA (2 PEN) 40 MG/0.4ML ~~LOC~~ AJKT
1.0000 | AUTO-INJECTOR | SUBCUTANEOUS | 6 refills | Status: DC
  Filled 2021-11-17: qty 4, fill #0
  Filled 2021-11-24 – 2021-12-16 (×2): qty 4, 28d supply, fill #0
  Filled 2022-01-05 – 2022-01-06 (×2): qty 4, 28d supply, fill #1
  Filled 2022-02-02: qty 4, 28d supply, fill #2
  Filled 2022-02-21 – 2022-02-28 (×4): qty 4, 28d supply, fill #3
  Filled 2022-03-17: qty 4, 28d supply, fill #4
  Filled 2022-04-14: qty 4, 28d supply, fill #5
  Filled 2022-05-17: qty 4, 28d supply, fill #6

## 2021-11-17 NOTE — Telephone Encounter (Signed)
The pt called to report a reaction at the site of his humira injection for the past 2 months. He has altered injection sites with same burning, itching and raised red rash for 3-4 days post injection.  He is asking for humira citrate free prescription.  In April he reported that his insurance would not cover the citrate free however at this time he has a reaction to the injection so we will send to his pharmacy and see if a PA is needed.   I spoke with Dr Hilarie Fredrickson who gave a verbal order to send in script. Prescription sent for Humira Citrate free due to citrate allergy.

## 2021-11-17 NOTE — Telephone Encounter (Signed)
Patient called and said the last couple of injections of Humira he's had has been stinging, burning, and leaving raised areas at the injection site that lasts for 3-4 days.  The pharmacist suggested he call here and see if he could get the prescription to be citrate-free.  Please advise.  Thank you.

## 2021-11-23 ENCOUNTER — Other Ambulatory Visit (HOSPITAL_COMMUNITY): Payer: Self-pay

## 2021-11-23 ENCOUNTER — Telehealth: Payer: Self-pay | Admitting: Neurology

## 2021-11-23 NOTE — Telephone Encounter (Signed)
Pt said on August 31 [employer] faxed over [facility] paperwork for Dr. Jaynee Eagles to fill out .

## 2021-11-24 ENCOUNTER — Other Ambulatory Visit (HOSPITAL_COMMUNITY): Payer: Self-pay

## 2021-11-24 DIAGNOSIS — Z0289 Encounter for other administrative examinations: Secondary | ICD-10-CM

## 2021-11-24 NOTE — Telephone Encounter (Signed)
Patient called stating that he has not heard anything more about changing the Humira Pen. I advised patient that it looked like it had been sent to the pharmacy on 8/30 and he stated that he had not gotten a call from the pharmacy. Patient is requesting a call back to discuss. Please advise.

## 2021-11-24 NOTE — Telephone Encounter (Signed)
I spoke with the pt and notified him that [facility] received the prescription on 8/30.  He will call them and inquire.

## 2021-11-25 ENCOUNTER — Telehealth: Payer: Self-pay | Admitting: Pharmacy Technician

## 2021-11-25 ENCOUNTER — Other Ambulatory Visit (HOSPITAL_COMMUNITY): Payer: Self-pay

## 2021-11-25 NOTE — Telephone Encounter (Signed)
Patient Advocate Encounter  Received notification that prior authorization for HUMIRA 40MG CF is required.   PA submitted on 9.7.23 Key [id number] Status is pending    Luciano Cutter, CPhT Patient Advocate Phone: [phone]

## 2021-11-29 ENCOUNTER — Other Ambulatory Visit (HOSPITAL_COMMUNITY): Payer: Self-pay

## 2021-11-29 NOTE — Telephone Encounter (Signed)
FMLA/ [employer], completed. To Dr. Jaynee Eagles to sign, then to medial records.

## 2021-11-30 ENCOUNTER — Telehealth: Payer: Self-pay | Admitting: Neurology

## 2021-11-30 NOTE — Telephone Encounter (Signed)
CPAP Titration- [facility] no auth req ref # [id number] K on 11/30/21.  Patient is scheduled at [facility] for 12/07/21 at 8 pm.  Mailed packet to the patient.

## 2021-11-30 NOTE — Telephone Encounter (Signed)
Pt FMLA form faxed to [employer] on 11/30/2021

## 2021-11-30 NOTE — Telephone Encounter (Signed)
Signed.  Sent to Medical records.

## 2021-12-01 ENCOUNTER — Other Ambulatory Visit (HOSPITAL_COMMUNITY): Payer: Self-pay

## 2021-12-02 ENCOUNTER — Other Ambulatory Visit (HOSPITAL_COMMUNITY): Payer: Self-pay

## 2021-12-03 NOTE — Telephone Encounter (Signed)
Dr Carlean Purl you are DOD this afternoon can you please review for the pt.  A form needs to be completed by the provider.  I am not sure what form this is, I have never had this come up before.

## 2021-12-06 ENCOUNTER — Other Ambulatory Visit (HOSPITAL_COMMUNITY): Payer: Self-pay

## 2021-12-06 NOTE — Telephone Encounter (Signed)
I found the forms and printed them out.  They appear to be quite complicated and there is an online set of instructions to fill them out as well.  Patty, I am going to need your help filling out the form.  Lets talk about this tomorrow.

## 2021-12-07 ENCOUNTER — Ambulatory Visit (INDEPENDENT_AMBULATORY_CARE_PROVIDER_SITE_OTHER): Payer: 59 | Admitting: Neurology

## 2021-12-07 DIAGNOSIS — E669 Obesity, unspecified: Secondary | ICD-10-CM

## 2021-12-07 DIAGNOSIS — G4733 Obstructive sleep apnea (adult) (pediatric): Secondary | ICD-10-CM | POA: Diagnosis not present

## 2021-12-07 DIAGNOSIS — G43709 Chronic migraine without aura, not intractable, without status migrainosus: Secondary | ICD-10-CM

## 2021-12-07 DIAGNOSIS — G4719 Other hypersomnia: Secondary | ICD-10-CM

## 2021-12-07 DIAGNOSIS — R0683 Snoring: Secondary | ICD-10-CM

## 2021-12-07 DIAGNOSIS — R519 Headache, unspecified: Secondary | ICD-10-CM

## 2021-12-07 NOTE — Telephone Encounter (Signed)
Need to call patient and clarify dates, onset of rash with Humira so can fill out forms

## 2021-12-07 NOTE — Telephone Encounter (Signed)
Dr Carlean Purl,   I was able to speak with the patient.  He stated that he has been giving himself Humira injections every Wednesday.  He is alternating his injection site each week.  He has noticed that the injection site is "bubbled up" and raised immediately after each injection.  He stated his skin becomes discolored with dark spots as well.  This typically lasts for 3 to 4 days post injection. He reported these reactions to our office on November 17, 2021.  He stated that these reactions had occurred 2 months prior (June 2023) to the date he contacted our office.  Patient also reported that he has had issues (sensitivity with burning) with Humira in the past.  He had contacted our office in July 2022 and reported burning at injection site. He does better with citrate free injections.

## 2021-12-08 NOTE — Telephone Encounter (Signed)
Form completed and will fax today

## 2021-12-09 NOTE — Telephone Encounter (Signed)
Can you upload a copy of the MedWatch form to the patients chart? That is the only thing needed from [name] for me to complete their review. You would still need to fax (if you hadn't already) to the FDA, but only the copy of the completed form is needed for [employer].

## 2021-12-10 ENCOUNTER — Encounter: Payer: Self-pay | Admitting: Internal Medicine

## 2021-12-10 ENCOUNTER — Other Ambulatory Visit (HOSPITAL_COMMUNITY): Payer: Self-pay

## 2021-12-10 ENCOUNTER — Ambulatory Visit: Payer: 59 | Admitting: Internal Medicine

## 2021-12-10 VITALS — BP 136/82 | HR 60 | Temp 98.2°F | Resp 18 | Ht 68.0 in | Wt 226.5 lb

## 2021-12-10 DIAGNOSIS — B028 Zoster with other complications: Secondary | ICD-10-CM

## 2021-12-10 MED ORDER — VALACYCLOVIR HCL 1 G PO TABS
1000.0000 mg | ORAL_TABLET | Freq: Three times a day (TID) | ORAL | 0 refills | Status: DC
  Filled 2021-12-10: qty 21, 7d supply, fill #0
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
## 2021-12-10 NOTE — Telephone Encounter (Signed)
Medwatch has been scanned to pt's chart. Faxed PA request and Med[employer] form to [place]. Will continue to follow and will update upon decision.

## 2021-12-10 NOTE — Patient Instructions (Signed)
Start Valtrex as soon as possible  Call if in the next 2 to 3 days you still see new bumps  The pain may persist for a few days, if you are not back to normal in 2 to 3 weeks please let me know.  Also, if the pain is severe.    Shingles  Shingles, which is also known as herpes zoster, is an infection that causes a painful skin rash and fluid-filled blisters. It is caused by a virus. Shingles only develops in people who: Have had chickenpox. Have been vaccinated against chickenpox. Shingles is rare in this group. What are the causes? Shingles is caused by varicella-zoster virus. This is the same virus that causes chickenpox. After a person is exposed to the virus, it stays in the body in an inactive (dormant) state. Shingles develops if the virus is reactivated. This can happen many years after the first (initial) exposure to the virus. It is not known what causes this virus to be reactivated. What increases the risk? People who have had chickenpox or received the chickenpox vaccine are at risk for shingles. Shingles infection is more common in people who: Are older than 47 years of age. Have a weakened disease-fighting system (immune system), such as people with: HIV (human immunodeficiency virus). AIDS (acquired immunodeficiency syndrome). Cancer. Are taking medicines that weaken the immune system, such as organ transplant medicines. Are experiencing a lot of stress. What are the signs or symptoms? Early symptoms of this condition include itching, tingling, and pain in an area on your skin. Pain may be described as burning, stabbing, or throbbing. A few days or weeks after early symptoms start, a painful red rash appears. The rash is usually on one side of the body and has a band-like or belt-like pattern. The rash eventually turns into fluid-filled blisters that break open, change into scabs, and dry up in about 2-3 weeks. At any time during the infection, you may also develop: A  fever. Chills. A headache. Nausea. How is this diagnosed? This condition is diagnosed with a skin exam. Skin or fluid samples (a culture) may be taken from the blisters before a diagnosis is made. How is this treated? The rash may last for several weeks. There is not a specific cure for this condition. Your health care provider may prescribe medicines to help you manage pain, recover more quickly, and avoid long-term problems. Medicines may include: Antiviral medicines. Anti-inflammatory medicines. Pain medicines. Anti-itching medicines (antihistamines). If the area involved is on your face, you may be referred to a specialist, such as an eye doctor (ophthalmologist) or an ear, nose, and throat (ENT) doctor (otorhinolaryngologist) to help you avoid eye problems, chronic pain, or disability. Follow these instructions at home: Medicines Take over-the-counter and prescription medicines only as told by your health care provider. Apply an anti-itch cream or numbing cream to the affected area as told by your health care provider. Relieving itching and discomfort  Apply cold, wet cloths (cold compresses) to the area of the rash or blisters as told by your health care provider. Cool baths can be soothing. Try adding baking soda or dry oatmeal to the water to reduce itching. Do not bathe in hot water. Use calamine lotion as recommended by your health care provider. This is an over-the-counter lotion that helps to relieve itchiness. Blister and rash care Keep your rash covered with a loose bandage (dressing). Wear loose-fitting clothing to help ease the pain of material rubbing against the rash. Wash your hands  with soap and water for at least 20 seconds before and after you change your dressing. If soap and water are not available, use hand sanitizer. Change your dressing as told by your health care provider. Keep your rash and blisters clean by washing the area with mild soap and cool water as told  by your health care provider. Check your rash every day for signs of infection. Check for: More redness, swelling, or pain. Fluid or blood. Warmth. Pus or a bad smell. Do not scratch your rash or pick at your blisters. To help avoid scratching: Keep your fingernails clean and cut short. Wear gloves or mittens while you sleep, if scratching is a problem. General instructions Rest as told by your health care provider. Wash your hands often with soap and water for at least 20 seconds. If soap and water are not available, use hand sanitizer. Doing this lowers your chance of getting a bacterial skin infection. Before your blisters change into scabs, your shingles infection can cause chickenpox in people who have never had it or have never been vaccinated against it. To prevent this from happening, avoid contact with other people, especially: Babies. Pregnant women. Children who have eczema. Older people who have transplants. People who have chronic illnesses, such as cancer or AIDS. Keep all follow-up visits. This is important. How is this prevented? Getting vaccinated is the best way to prevent shingles and protect against shingles complications. If you have not been vaccinated, talk with your health care provider about getting the vaccine. Where to find more information Centers for Disease Control and Prevention: http://www.wolf.info/ Contact a health care provider if: Your pain is not relieved with prescribed medicines. Your pain does not get better after the rash heals. You have any of these signs of infection: More redness, swelling, or pain around the rash. Fluid or blood coming from the rash. Warmth coming from your rash. Pus or a bad smell coming from the rash. A fever. Get help right away if: The rash is on your face or nose. You have facial pain, pain around your eye area, or loss of feeling on one side of your face. You have difficulty seeing. You have ear pain or have ringing in your  ear. You have a loss of taste. Your condition gets worse. Summary Shingles, also known as herpes zoster, is an infection that causes a painful skin rash and fluid-filled blisters. This condition is diagnosed with a skin exam. Skin or fluid samples (a culture) may be taken from the blisters. Keep your rash covered with a loose bandage (dressing). Wear loose-fitting clothing to help ease the pain of material rubbing against the rash. Before your blisters change into scabs, your shingles infection can cause chickenpox in people who have never had it or have never been vaccinated against it. This information is not intended to replace advice given to you by your health care provider. Make sure you discuss any questions you have with your health care provider. Document Revised: 03/02/2020 Document Reviewed: 03/02/2020 Elsevier Patient Education  [place].

## 2021-12-11 NOTE — Assessment & Plan Note (Signed)
Shingles: Sx c/w shingles, explained the patient what that is and what to expect. Recommend Valtrex ASAP, if he is not getting better let me know.  He is contagious as long as the blisters are present.

## 2021-12-13 ENCOUNTER — Other Ambulatory Visit (HOSPITAL_COMMUNITY): Payer: Self-pay

## 2021-12-15 ENCOUNTER — Telehealth: Payer: Self-pay | Admitting: Internal Medicine

## 2021-12-15 NOTE — Telephone Encounter (Signed)
Patient called regarding Humira 40 mg injection, stated his skin becomes discolored with dark spots as that for a couple of days along with a rash.Please advise

## 2021-12-16 ENCOUNTER — Other Ambulatory Visit (HOSPITAL_COMMUNITY): Payer: Self-pay

## 2021-12-17 NOTE — Telephone Encounter (Signed)
Spoke with pt. Pt stated that he  would have to call our office back because he was in the middle of doing something

## 2021-12-18 NOTE — Procedures (Signed)
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 His migraines manifest with photophobia, phonophobia, nausea- with aura. HTN, autoimmune Colitis on HUMIRA, status post Covid infection which left him temporarily with altered sense of smell/ taste. His wife has noted snoring and apneas. Phuc Kluttz returns for a titration study following a sleep study on 05-31-2021 which resulted in a diagnosis of mild sleep apnea with an AHI of 9.4/h, all non- supine sleep. REM AHI was 33/h, NREM AHI was 1.8/h.  ADDITIONAL INFORMATION:  The Epworth Sleepiness Scale endorsed at 14 /24 points (scores above or equal to 10 are suggestive of hypersomnolence). FSS endorsed at 40 /63 points.  Height: 69 in Weight: 226 lb (BMI 33) Neck Size: 18   MEDICATIONS: Humira, Norvasc, Imuran, Zyrtec, Cozaar, Lopressor TECHNICAL DESCRIPTION: A registered sleep technologist (RPSGT)  was in attendance for the duration of the recording.  Data collection, scoring, video monitoring, and reporting were performed in compliance with the AASM Manual for the Scoring of Sleep and Associated Events; (Hypopnea is scored based on the criteria listed in Section VIII D. 1b in the AASM Manual V2.6 using a 4% oxygen desaturation rule or Hypopnea is scored based on the criteria listed in Section VIII D. 1a in the AASM Manual V2.6 using 3% oxygen desaturation and /or arousal rule).  CPAP was initiated at 5 cm water pressure under the use of a medium sized FFM Simplus.  After about 3 hours of sleep time a pressure of 6 cm was initiated and kept for the rest of the night, another 3 hours. It was at 6 cm water that REM sleep and N3 sleep rebounded. The sleep efficiency increased from 55% to 80%.    lights-on at  05:30:, with  8.9 minutes of recording time . Total sleep time (TST) was 358.5 minutes with a decreased sleep efficiency at 67.5%.   Sleep latency was increased at 46.5 minutes.  REM sleep latency was increased at 155.5 minutes. Of the total sleep time, the percentage of stage N1 sleep was 16.3%, stage N2 sleep was 60%, stage N3 sleep was 9.1%, and REM sleep was 14.4%. BODY POSITION:  TST was divided between the following sleep positions: : supine 27 minutes (8%), non-supine 332 minutes (92%); further differentiated into :  right 152 minutes (43%), left 179 minutes (50%), and prone 00 minutes (0%).  Total supine REM sleep time was 00 minutes (0% of total REM sleep).  There were 3 Stage R periods observed on this study night, 51 awakenings (i.e. transitions to Stage W from any sleep stage), and 153 total stage transitions. Wake after sleep onset (WASO) time accounted for 126 minutes.   RESPIRATORY MONITORING:  Based on AASM criteria (using a 3% oxygen desaturation and /or arousal rule for scoring hypopneas), there were 0 apneas (0 obstructive; 0 central; 0 mixed), and 7 hypopneas.  Apnea index was 0.0/h. Hypopnea index was 1.2/h. The AHI (Apnea-Hypopnea Index) for  the whole titration study was 1.2/ overall (AHI was 0.0/h in supine, 1/h non-supine; 1.2/h during REM, 0.0/h NREM).  There were 0 respiratory effort-related arousals (RERAs).  OXIMETRY: Oxyhemoglobin Saturation Nadir during sleep was at  91% from a mean of 97%.  Of the Total sleep time (TST) hypoxemia (<89%) was present for 0.0 minutes, or 0.0% of total sleep time.  LIMB MOVEMENTS: There were 60 periodic limb movements of sleep (10.0/h), of which 0 (0.0/h) were associated with an arousal. AROUSAL: There  were 73 arousals in total.  Of these, 4 were identified as respiratory-related arousals (1 /h), 0 were PLM-related arousals (0 /h), and 69 were non-specific arousals (12 /h). There were 0 occurrences of Cheyne Stokes breathing.  Snoring was classified as absent. EEG:  PSG EEG was of normal amplitude and frequency, with symmetric manifestation of sleep stages. EKG: The electrocardiogram documented NSR.  The average heart rate during sleep was 63 bpm.  The heart rate during sleep varied between a minimum of  and  a maximum of  80 bpm. AUDIO and VIDEO: Restless sleeper, but no vocalization, complex movements during sleep, or REM -PLM activity.   IMPRESSION: 1) Sleep disordered breathing in form of mild OSA, REM sleep dependent,  responded to 5 and 6 cm CPAP, rather very low pressures. The resulting AHI was 1/h and 1.4 /h. no hypoxemia was recorded. No PLMs. This patient would be classified as mild sleep apnea with UARS.     2) Total sleep time was reduced at 358.5 minutes.  Sleep efficiency was decreased at 67.5%.   Sleep fragmentation was noted- The majority of sleep arousals was related to spontaneous arousals that I interpreted as discomfort with the CPAP or its interface under 5 cm water pressure-. It was at 6 cm water that REM sleep and N3 sleep rebounded. The sleep efficiency increased from 55% to 80%.  A FFM was provided during the PSG for this patient - a SIMPLUS in medium size.  RECOMMENDATIONS: I feel that he may do well with a nasal pillow or cradle interface, or a dream wear, and less apprehensive about the set up.  6 cm water pressure was better tolerated than 5 cm- please refit MASK and set his ResMED humidified auto CPAP from 6-10 cm water with 1 cm EPR.  Larey Seat, MD

## 2021-12-18 NOTE — Addendum Note (Signed)
Addended by: Larey Seat on: 12/18/2021 01:21 PM   Modules accepted: Orders

## 2021-12-20 NOTE — Telephone Encounter (Signed)
Spoke with Pt in regard message that was received: Pt stated that he is not having any complications with his Humira: Pt stated that he received a notice that his Humira had been approved last week and that he picked up his medication on Thursday and gave himself an injection: Pt stated that he did not have any dark spots or rash: Pt stated that that message was from back in August:  Pt stated that he is doing great with no complications: Pt verbalized understanding with all questions answered.

## 2021-12-21 ENCOUNTER — Telehealth: Payer: Self-pay | Admitting: *Deleted

## 2021-12-21 ENCOUNTER — Encounter: Payer: Self-pay | Admitting: *Deleted

## 2021-12-21 NOTE — Telephone Encounter (Signed)
-----   Message from Larey Seat, MD sent at 12/18/2021  1:21 PM EDT ----- returns for a titration study following a sleep study which resulted in a diagnosis of mild sleep apnea with an AHI of 9.4/h, all non- supine sleep. REM AHI was 33/h, NREM AHI was 1.8/h.  IMPRESSION: 1) Sleep disordered breathing in form of mild OSA, REM sleep dependent,  responded to 5 and 6 cm CPAP, rather very low pressures. The resulting AHI was 1/h and 1.4 /h. no hypoxemia was recorded. No PLMs. This patient would be classified as mild sleep apnea with UARS.   2) Total sleep time was reducedat 358.26mnutes. Sleep efficiency was decreasedat 67.5%. Sleep fragmentationwas noted- The majority of sleep arousals was related to spontaneous arousals that I interpreted as discomfort with the CPAP or its interface under 5 cm water pressure-. It was at 6 cm water that REM sleep and N3 sleep rebounded. The sleep efficiency increased from 55% to 80%.  A FFM was provided during the PSG for this patient - a SIMPLUS in medium size.  RECOMMENDATIONS: I feel that he may do well with a nasal pillow or cradle interface, or a dream wear, and less apprehensive about the set up.  6 cm water pressure was better tolerated than 5 cm- please refit MASK and set his ResMED humidified auto CPAP from 6-10 cm water with 1 cm EPR.  CarmenDohmeier, MD

## 2021-12-21 NOTE — Telephone Encounter (Signed)
I called pt. I advised pt that Dr. Brett Fairy reviewed their sleep study results and found that pt has mild OSA. Dr. Brett Fairy recommends that pt start on CPAP. I reviewed PAP compliance expectations with the pt. Pt is agreeable to starting a CPAP. I advised pt that an order will be sent to a DME, [facility], and [facility] will call the pt within about one week after they file with the pt's insurance. [facility] will show the pt how to use the machine, fit for masks, and troubleshoot the CPAP if needed. A follow up appt was made for insurance purposes with Bobbie Stack on 03/25/22 at 8:45am/mychart VV (pt agreeable to VV). Pt verbalized understanding to arrive 15 minutes early and bring their CPAP. A letter and mychart message with all of this information in it will be sent to the pt as a reminder. I verified with the pt that the address we have on file is correct. Pt verbalized understanding of results. Pt had no questions at this time but was encouraged to call back if questions arise. I have sent the order to [facility] and have received confirmation that they have received the order.

## 2021-12-22 ENCOUNTER — Encounter: Payer: Self-pay | Admitting: Neurology

## 2021-12-22 NOTE — Telephone Encounter (Signed)
[facility] is not in network with the patient's insurance. Will send to [facility].

## 2021-12-22 NOTE — Telephone Encounter (Signed)
Patient Advocate Encounter  Prior Authorization for Corey Costa has been approved.    PA# [id number] Effective dates: 9.22.23 through 9.21.24  Corey Costa B. CPhT P: [phone] F: [phone]

## 2021-12-23 ENCOUNTER — Other Ambulatory Visit (HOSPITAL_COMMUNITY): Payer: Self-pay

## 2021-12-23 ENCOUNTER — Ambulatory Visit: Payer: 59 | Admitting: Neurology

## 2021-12-29 ENCOUNTER — Ambulatory Visit: Payer: 59 | Admitting: Neurology

## 2022-01-05 ENCOUNTER — Other Ambulatory Visit (HOSPITAL_COMMUNITY): Payer: Self-pay

## 2022-01-05 ENCOUNTER — Telehealth: Payer: Self-pay | Admitting: Neurology

## 2022-01-05 NOTE — Telephone Encounter (Signed)
Pt is calling. Stated he received a call from [facility]. Stating they need a prescription for the CPAP machine and the settings.

## 2022-01-05 NOTE — Telephone Encounter (Signed)
Sent the patient a Field seismologist him. On 12/22/21 orders were already sent and that had the order for the machine on the order. I will send a message as well to [facility] but [facility] should have this.

## 2022-01-06 ENCOUNTER — Other Ambulatory Visit (HOSPITAL_COMMUNITY): Payer: Self-pay

## 2022-01-06 NOTE — Telephone Encounter (Signed)
Received an update from [facility]

## 2022-01-10 ENCOUNTER — Other Ambulatory Visit (HOSPITAL_COMMUNITY): Payer: Self-pay

## 2022-01-10 DIAGNOSIS — G4733 Obstructive sleep apnea (adult) (pediatric): Secondary | ICD-10-CM | POA: Diagnosis not present

## 2022-01-12 ENCOUNTER — Other Ambulatory Visit (HOSPITAL_COMMUNITY): Payer: Self-pay

## 2022-01-17 ENCOUNTER — Other Ambulatory Visit: Payer: Self-pay | Admitting: Internal Medicine

## 2022-01-17 ENCOUNTER — Other Ambulatory Visit (HOSPITAL_COMMUNITY): Payer: Self-pay

## 2022-01-17 MED ORDER — METOPROLOL TARTRATE 50 MG PO TABS
ORAL_TABLET | ORAL | 1 refills | Status: DC
  Filled 2022-01-17: qty 270, 90d supply, fill #0
  Filled 2022-04-18: qty 270, 90d supply, fill #1

## 2022-01-17 MED ORDER — LOSARTAN POTASSIUM 50 MG PO TABS
100.0000 mg | ORAL_TABLET | Freq: Every day | ORAL | 1 refills | Status: DC
  Filled 2022-01-17: qty 180, 90d supply, fill #0
  Filled 2022-04-18: qty 180, 90d supply, fill #1

## 2022-01-19 ENCOUNTER — Other Ambulatory Visit (HOSPITAL_COMMUNITY): Payer: Self-pay

## 2022-01-20 ENCOUNTER — Other Ambulatory Visit (HOSPITAL_COMMUNITY): Payer: Self-pay

## 2022-01-21 ENCOUNTER — Other Ambulatory Visit (HOSPITAL_COMMUNITY): Payer: Self-pay

## 2022-01-24 ENCOUNTER — Other Ambulatory Visit (HOSPITAL_COMMUNITY): Payer: Self-pay

## 2022-01-25 ENCOUNTER — Other Ambulatory Visit (HOSPITAL_COMMUNITY): Payer: Self-pay

## 2022-01-28 ENCOUNTER — Other Ambulatory Visit (HOSPITAL_COMMUNITY): Payer: Self-pay

## 2022-02-02 ENCOUNTER — Other Ambulatory Visit (HOSPITAL_COMMUNITY): Payer: Self-pay

## 2022-02-08 ENCOUNTER — Ambulatory Visit: Payer: 59 | Attending: Internal Medicine | Admitting: Pharmacist

## 2022-02-08 DIAGNOSIS — Z79899 Other long term (current) drug therapy: Secondary | ICD-10-CM

## 2022-02-08 NOTE — Progress Notes (Signed)
   S: Patient presents  for review of their specialty medication therapy.  Patient is currently taking Humira for ulcerative colitis. Patient is managed by Dr. Ardis Hughs for this.   Adherence: confirms  Efficacy: he continues to do well on Humira with good results   Dosing:  Ulcerative colitis: 40 mg every week.  Dose adjustments: Renal: no dose adjustments (has not been studied) Hepatic: no dose adjustments (has not been studied)  Screening: TB test: negative Hepatitis: negative  Monitoring: S/sx of injection site reactions: denies  S/sx of infection: denies CBC: see below S/sx of hypersensitivity: denies S/sx of malignancy: denies S/sx of heart failure: denies  O:  Lab Results  Component Value Date   WBC 6.4 05/13/2021   HGB 14.9 05/13/2021   HCT 44.5 05/13/2021   MCV 100.7 (H) 05/13/2021   PLT 293.0 05/13/2021      Chemistry      Component Value Date/Time   NA 142 05/13/2021 0902   K 4.1 05/13/2021 0902   CL 104 05/13/2021 0902   CO2 36 (H) 05/13/2021 0902   BUN 12 05/13/2021 0902   CREATININE 1.28 05/13/2021 0902   CREATININE 1.33 01/03/2020 1608      Component Value Date/Time   CALCIUM 9.2 05/13/2021 0902   ALKPHOS 72 05/13/2021 0902   AST 14 05/13/2021 0902   ALT 12 05/13/2021 0902   BILITOT 0.8 05/13/2021 0902      A/P: 1. Medication review: Patient currently on Humira for ulcerative colitis. Reviewed the medication with the patient, including the following: Humira is a TNF blocking agent indicated for ankylosing spondylitis, Crohn's disease, Hidradenitis suppurativa, psoriatic arthritis, plaque psoriasis, ulcerative colitis, and uveitis. Patient educated on purpose, proper use and potential adverse effects of Humira. The most common adverse effects are infections, headache, and injection site reactions. There is the possibility of an increased risk of malignancy but it is not well understood if this increased risk is due to there medication or the  disease state. There are rare cases of pancytopenia and aplastic anemia. No recommendations for any changes at this time.   Benard Halsted, PharmD, [place], [place] [phone]

## 2022-02-14 DIAGNOSIS — G4733 Obstructive sleep apnea (adult) (pediatric): Secondary | ICD-10-CM | POA: Diagnosis not present

## 2022-02-21 ENCOUNTER — Other Ambulatory Visit (HOSPITAL_COMMUNITY): Payer: Self-pay

## 2022-02-22 ENCOUNTER — Other Ambulatory Visit (HOSPITAL_COMMUNITY): Payer: Self-pay

## 2022-02-25 ENCOUNTER — Other Ambulatory Visit (HOSPITAL_COMMUNITY): Payer: Self-pay

## 2022-02-28 ENCOUNTER — Other Ambulatory Visit: Payer: Self-pay

## 2022-02-28 ENCOUNTER — Other Ambulatory Visit (HOSPITAL_COMMUNITY): Payer: Self-pay

## 2022-03-01 ENCOUNTER — Other Ambulatory Visit (HOSPITAL_COMMUNITY): Payer: Self-pay

## 2022-03-15 ENCOUNTER — Other Ambulatory Visit: Payer: Self-pay

## 2022-03-16 DIAGNOSIS — G4733 Obstructive sleep apnea (adult) (pediatric): Secondary | ICD-10-CM | POA: Diagnosis not present

## 2022-03-17 ENCOUNTER — Other Ambulatory Visit (HOSPITAL_COMMUNITY): Payer: Self-pay

## 2022-03-22 ENCOUNTER — Other Ambulatory Visit (HOSPITAL_COMMUNITY): Payer: Self-pay

## 2022-03-22 ENCOUNTER — Other Ambulatory Visit: Payer: Self-pay

## 2022-03-24 ENCOUNTER — Encounter: Payer: Self-pay | Admitting: *Deleted

## 2022-03-25 ENCOUNTER — Telehealth: Payer: 59 | Admitting: Adult Health

## 2022-03-28 ENCOUNTER — Other Ambulatory Visit (HOSPITAL_COMMUNITY): Payer: Self-pay

## 2022-04-05 ENCOUNTER — Other Ambulatory Visit (HOSPITAL_COMMUNITY): Payer: Self-pay

## 2022-04-14 ENCOUNTER — Other Ambulatory Visit (HOSPITAL_COMMUNITY): Payer: Self-pay

## 2022-04-18 ENCOUNTER — Other Ambulatory Visit (HOSPITAL_COMMUNITY): Payer: Self-pay
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
## 2022-04-19 ENCOUNTER — Ambulatory Visit (INDEPENDENT_AMBULATORY_CARE_PROVIDER_SITE_OTHER): Payer: 59 | Admitting: Adult Health

## 2022-04-19 ENCOUNTER — Encounter: Payer: Self-pay | Admitting: Adult Health

## 2022-04-19 VITALS — BP 137/85 | HR 70 | Ht 67.0 in | Wt 234.7 lb

## 2022-04-19 DIAGNOSIS — G4733 Obstructive sleep apnea (adult) (pediatric): Secondary | ICD-10-CM | POA: Diagnosis not present

## 2022-04-19 NOTE — Patient Instructions (Signed)
Continue using CPAP nightly and greater than 4 hours each night °If your symptoms worsen or you develop new symptoms please let us know.  ° °

## 2022-04-20 ENCOUNTER — Other Ambulatory Visit (HOSPITAL_COMMUNITY): Payer: Self-pay

## 2022-04-21 ENCOUNTER — Other Ambulatory Visit (HOSPITAL_COMMUNITY): Payer: Self-pay

## 2022-04-21 ENCOUNTER — Other Ambulatory Visit: Payer: Self-pay

## 2022-04-27 DIAGNOSIS — G4733 Obstructive sleep apnea (adult) (pediatric): Secondary | ICD-10-CM | POA: Diagnosis not present

## 2022-05-02 ENCOUNTER — Other Ambulatory Visit (HOSPITAL_BASED_OUTPATIENT_CLINIC_OR_DEPARTMENT_OTHER): Payer: Self-pay

## 2022-05-06 DIAGNOSIS — G4733 Obstructive sleep apnea (adult) (pediatric): Secondary | ICD-10-CM | POA: Diagnosis not present

## 2022-05-07 DIAGNOSIS — G4733 Obstructive sleep apnea (adult) (pediatric): Secondary | ICD-10-CM | POA: Diagnosis not present

## 2022-05-17 ENCOUNTER — Encounter: Payer: Self-pay | Admitting: Internal Medicine

## 2022-05-17 ENCOUNTER — Ambulatory Visit (INDEPENDENT_AMBULATORY_CARE_PROVIDER_SITE_OTHER): Payer: 59 | Admitting: Internal Medicine

## 2022-05-17 ENCOUNTER — Other Ambulatory Visit (HOSPITAL_COMMUNITY): Payer: Self-pay

## 2022-05-17 VITALS — BP 126/82 | HR 72 | Temp 98.4°F | Resp 18 | Ht 68.0 in | Wt 232.1 lb

## 2022-05-17 DIAGNOSIS — I10 Essential (primary) hypertension: Secondary | ICD-10-CM

## 2022-05-17 DIAGNOSIS — R739 Hyperglycemia, unspecified: Secondary | ICD-10-CM

## 2022-05-17 DIAGNOSIS — Z0000 Encounter for general adult medical examination without abnormal findings: Secondary | ICD-10-CM | POA: Diagnosis not present

## 2022-05-17 DIAGNOSIS — K51911 Ulcerative colitis, unspecified with rectal bleeding: Secondary | ICD-10-CM | POA: Diagnosis not present

## 2022-05-17 DIAGNOSIS — R7989 Other specified abnormal findings of blood chemistry: Secondary | ICD-10-CM

## 2022-05-17 LAB — LIPID PANEL
Cholesterol: 185 mg/dL (ref 0–200)
HDL: 44.9 mg/dL (ref >39.00)
LDL Cholesterol: 127 mg/dL — ABNORMAL HIGH (ref 0–99)
NonHDL: 139.62
Total CHOL/HDL Ratio: 4
Triglycerides: 63 mg/dL (ref 0.0–149.0)
VLDL: 12.6 mg/dL (ref 0.0–40.0)

## 2022-05-17 LAB — CBC WITH DIFFERENTIAL/PLATELET
Basophils Absolute: 0 10*3/uL (ref 0.0–0.1)
Basophils Relative: 0.5 % (ref 0.0–3.0)
Eosinophils Absolute: 0.1 10*3/uL (ref 0.0–0.7)
Eosinophils Relative: 1.6 % (ref 0.0–5.0)
HCT: 44.7 % (ref 39.0–52.0)
Hemoglobin: 15.1 g/dL (ref 13.0–17.0)
Lymphocytes Relative: 19.8 % (ref 12.0–46.0)
Lymphs Abs: 1.8 10*3/uL (ref 0.7–4.0)
MCHC: 33.7 g/dL (ref 30.0–36.0)
MCV: 99.2 fl (ref 78.0–100.0)
Monocytes Absolute: 0.9 10*3/uL (ref 0.1–1.0)
Monocytes Relative: 10.3 % (ref 3.0–12.0)
Neutro Abs: 6.2 10*3/uL (ref 1.4–7.7)
Neutrophils Relative %: 67.8 % (ref 43.0–77.0)
Platelets: 369 10*3/uL (ref 150.0–400.0)
RBC: 4.51 Mil/uL (ref 4.22–5.81)
RDW: 14.8 % (ref 11.5–15.5)
WBC: 9.2 10*3/uL (ref 4.0–10.5)

## 2022-05-17 LAB — COMPREHENSIVE METABOLIC PANEL
ALT: 18 U/L (ref 0–53)
AST: 18 U/L (ref 0–37)
Albumin: 4.2 g/dL (ref 3.5–5.2)
Alkaline Phosphatase: 78 U/L (ref 39–117)
BUN: 13 mg/dL (ref 6–23)
CO2: 30 mEq/L (ref 19–32)
Calcium: 9.5 mg/dL (ref 8.4–10.5)
Chloride: 104 mEq/L (ref 96–112)
Creatinine, Ser: 1.09 mg/dL (ref 0.40–1.50)
GFR: 80.98 mL/min (ref >60.00)
Glucose, Bld: 112 mg/dL — ABNORMAL HIGH (ref 70–99)
Potassium: 3.7 mEq/L (ref 3.5–5.1)
Sodium: 142 mEq/L (ref 135–145)
Total Bilirubin: 0.7 mg/dL (ref 0.2–1.2)
Total Protein: 7.2 g/dL (ref 6.0–8.3)

## 2022-05-17 LAB — PSA: PSA: 0.47 ng/mL (ref 0.10–4.00)

## 2022-05-17 LAB — HEMOGLOBIN A1C: Hgb A1c MFr Bld: 6 % (ref 4.6–6.5)

## 2022-05-17 LAB — FOLATE: Folate: 17.2 ng/mL (ref >5.9)

## 2022-05-17 NOTE — Patient Instructions (Addendum)
Your exercise goal is 3 hours a week  We are referring you to gastroenterology.  You are due to see them in April.  You can reach them at [phone]  GO TO THE LAB : Get the blood work     [place], [place] back for   a physical exam in 1 year

## 2022-05-17 NOTE — Assessment & Plan Note (Signed)
Here for CPX Hyperglycemia: Check A1c HTN: Continue amlodipine, losartan, metoprolol.  Low-salt diet encouraged, check labs.  Recommend ambulatory BPs. Ulcerative colitis: Symptoms controlled, refer to [place] for [place]. RTC 1 year

## 2022-05-17 NOTE — Assessment & Plan Note (Signed)
-   Td 2010 and ~ 2019 (@ work) -prevnar 11/2016 - pnm 23  08/18/2017   - COVID vax booster rec, benefits d/w pt, he is at a high risk, declines -Had a flu shot - CCS: had a cscope 2019, next per GI -Prostate cancer screening: No FH, no symptoms, DRE negative, check PSA. -Labs: CMP FLP CBC 123456 PSA folic acid - Exercise: Recommend 3 hours a week.  Healthy diet encouraged.
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
## 2022-05-18 ENCOUNTER — Other Ambulatory Visit (HOSPITAL_COMMUNITY): Payer: Self-pay

## 2022-05-18 ENCOUNTER — Other Ambulatory Visit: Payer: Self-pay

## 2022-05-26 ENCOUNTER — Telehealth: Payer: Self-pay | Admitting: Neurology

## 2022-05-26 DIAGNOSIS — G4733 Obstructive sleep apnea (adult) (pediatric): Secondary | ICD-10-CM | POA: Diagnosis not present

## 2022-05-26 NOTE — Telephone Encounter (Signed)
Corey Costa can you arrange?

## 2022-05-26 NOTE — Telephone Encounter (Signed)
Pt asking to be called to re certify his FMLA and to pay fee for processing

## 2022-05-27 ENCOUNTER — Encounter: Payer: Self-pay | Admitting: Neurology

## 2022-05-30 ENCOUNTER — Encounter: Payer: Self-pay | Admitting: *Deleted

## 2022-05-30 ENCOUNTER — Other Ambulatory Visit (HOSPITAL_COMMUNITY): Payer: Self-pay

## 2022-05-30 DIAGNOSIS — Z0289 Encounter for other administrative examinations: Secondary | ICD-10-CM

## 2022-05-31 ENCOUNTER — Encounter: Payer: Self-pay | Admitting: *Deleted

## 2022-06-02 ENCOUNTER — Other Ambulatory Visit (HOSPITAL_COMMUNITY): Payer: Self-pay

## 2022-06-02 NOTE — Telephone Encounter (Signed)
FMLA forms completed and placed in [facility] in box to review and sign.

## 2022-06-05 DIAGNOSIS — G4733 Obstructive sleep apnea (adult) (pediatric): Secondary | ICD-10-CM | POA: Diagnosis not present

## 2022-06-14 ENCOUNTER — Other Ambulatory Visit: Payer: Self-pay | Admitting: Internal Medicine

## 2022-06-14 ENCOUNTER — Other Ambulatory Visit (HOSPITAL_COMMUNITY): Payer: Self-pay

## 2022-06-15 ENCOUNTER — Telehealth: Payer: Self-pay

## 2022-06-15 NOTE — Telephone Encounter (Signed)
Patient returned call and was scheduled with Dr Carlean Purl on 08-24-22 at 10:50am.  Patient agreed with plan and verbalized understanding.  No further questions.

## 2022-06-15 NOTE — Telephone Encounter (Signed)
-----   Message from [place] sent at 07/12/2021 10:23 AM EDT ----- Regarding: 1 year Needs to see DJ in 1 year, May 2024 dx UC

## 2022-06-15 NOTE — Telephone Encounter (Signed)
Left message for patient to return call to schedule 1 year follow up (May 2024) dx UC with Dr Carlean Purl in Dr Ardis Hughs absence.  Will continue efforts.

## 2022-06-17 ENCOUNTER — Other Ambulatory Visit: Payer: Self-pay | Admitting: Internal Medicine

## 2022-06-17 ENCOUNTER — Other Ambulatory Visit (HOSPITAL_COMMUNITY): Payer: Self-pay

## 2022-06-17 NOTE — Telephone Encounter (Signed)
Humira can be renewed until pt's next office visit which is scheduled for June with Dr. Carlean Purl. Humira should not be interrupted if possible from IBD perspective JMP

## 2022-06-20 ENCOUNTER — Other Ambulatory Visit (HOSPITAL_COMMUNITY): Payer: Self-pay

## 2022-06-20 ENCOUNTER — Other Ambulatory Visit: Payer: Self-pay | Admitting: Pharmacist

## 2022-06-20 ENCOUNTER — Other Ambulatory Visit: Payer: Self-pay | Admitting: Internal Medicine

## 2022-06-20 ENCOUNTER — Other Ambulatory Visit: Payer: Self-pay

## 2022-06-20 MED ORDER — AMLODIPINE BESYLATE 5 MG PO TABS
5.0000 mg | ORAL_TABLET | Freq: Every day | ORAL | 3 refills | Status: DC
  Filled 2022-06-20: qty 90, 90d supply, fill #0
  Filled 2022-10-11: qty 90, 90d supply, fill #1
  Filled 2023-02-06: qty 90, 90d supply, fill #2
  Filled 2023-05-09: qty 90, 90d supply, fill #3

## 2022-06-20 MED ORDER — HUMIRA (2 PEN) 40 MG/0.4ML ~~LOC~~ AJKT
1.0000 | AUTO-INJECTOR | SUBCUTANEOUS | 6 refills | Status: DC
  Filled 2022-06-20: qty 4, 28d supply, fill #0
  Filled 2022-07-06: qty 4, 28d supply, fill #1
  Filled 2022-08-11: qty 4, 28d supply, fill #2
  Filled 2022-09-06: qty 4, 28d supply, fill #3
  Filled 2022-10-04: qty 4, 28d supply, fill #4
  Filled 2022-11-01: qty 4, 28d supply, fill #5
  Filled 2022-11-24: qty 4, 28d supply, fill #6

## 2022-06-20 MED ORDER — HUMIRA (2 PEN) 40 MG/0.4ML ~~LOC~~ AJKT
1.0000 | AUTO-INJECTOR | SUBCUTANEOUS | 6 refills | Status: DC
  Filled 2022-06-20: qty 4, 28d supply, fill #0

## 2022-06-20 NOTE — Telephone Encounter (Signed)
Rx refilled Please tell him it is done and we look forward to seeing him for 08/24/22 appointment

## 2022-06-20 NOTE — Telephone Encounter (Signed)
Left detailed message on his voice mail that the refill has been done and reminded him of his upcoming appointment.

## 2022-06-26 DIAGNOSIS — G4733 Obstructive sleep apnea (adult) (pediatric): Secondary | ICD-10-CM | POA: Diagnosis not present

## 2022-07-04 ENCOUNTER — Other Ambulatory Visit (HOSPITAL_COMMUNITY): Payer: Self-pay

## 2022-07-05 NOTE — Telephone Encounter (Signed)
See other email stream.  Looks like this was faxed 06-02-2022.

## 2022-07-06 ENCOUNTER — Other Ambulatory Visit: Payer: Self-pay

## 2022-07-06 DIAGNOSIS — G4733 Obstructive sleep apnea (adult) (pediatric): Secondary | ICD-10-CM | POA: Diagnosis not present

## 2022-07-12 ENCOUNTER — Other Ambulatory Visit (HOSPITAL_COMMUNITY): Payer: Self-pay

## 2022-07-18 ENCOUNTER — Other Ambulatory Visit: Payer: Self-pay | Admitting: Internal Medicine

## 2022-07-19 ENCOUNTER — Other Ambulatory Visit (HOSPITAL_COMMUNITY): Payer: Self-pay

## 2022-07-19 MED ORDER — METOPROLOL TARTRATE 50 MG PO TABS
ORAL_TABLET | ORAL | 2 refills | Status: DC
  Filled 2022-07-19: qty 270, 90d supply, fill #0
  Filled 2022-11-16: qty 270, 90d supply, fill #1
  Filled 2023-02-27: qty 270, 90d supply, fill #2

## 2022-07-19 MED ORDER — LOSARTAN POTASSIUM 50 MG PO TABS
100.0000 mg | ORAL_TABLET | Freq: Every day | ORAL | 2 refills | Status: DC
  Filled 2022-07-19: qty 180, 90d supply, fill #0
  Filled 2022-11-16: qty 180, 90d supply, fill #1
  Filled 2023-02-27: qty 180, 90d supply, fill #2

## 2022-08-02 ENCOUNTER — Other Ambulatory Visit: Payer: Self-pay

## 2022-08-05 DIAGNOSIS — G4733 Obstructive sleep apnea (adult) (pediatric): Secondary | ICD-10-CM | POA: Diagnosis not present

## 2022-08-10 ENCOUNTER — Other Ambulatory Visit (HOSPITAL_COMMUNITY): Payer: Self-pay

## 2022-08-11 ENCOUNTER — Other Ambulatory Visit (HOSPITAL_COMMUNITY): Payer: Self-pay

## 2022-08-11 ENCOUNTER — Other Ambulatory Visit: Payer: Self-pay

## 2022-08-11 DIAGNOSIS — G4733 Obstructive sleep apnea (adult) (pediatric): Secondary | ICD-10-CM | POA: Diagnosis not present

## 2022-08-24 ENCOUNTER — Encounter: Payer: Self-pay | Admitting: Internal Medicine

## 2022-08-24 ENCOUNTER — Other Ambulatory Visit (INDEPENDENT_AMBULATORY_CARE_PROVIDER_SITE_OTHER): Payer: 59

## 2022-08-24 ENCOUNTER — Ambulatory Visit: Payer: 59 | Admitting: Internal Medicine

## 2022-08-24 VITALS — BP 138/78 | HR 72 | Ht 66.0 in | Wt 237.5 lb

## 2022-08-24 DIAGNOSIS — K5150 Left sided colitis without complications: Secondary | ICD-10-CM

## 2022-08-24 DIAGNOSIS — Z111 Encounter for screening for respiratory tuberculosis: Secondary | ICD-10-CM | POA: Diagnosis not present

## 2022-08-24 DIAGNOSIS — Z7960 Long term (current) use of unspecified immunomodulators and immunosuppressants: Secondary | ICD-10-CM | POA: Diagnosis not present

## 2022-08-24 LAB — HEPATIC FUNCTION PANEL
ALT: 19 U/L (ref 0–53)
AST: 22 U/L (ref 0–37)
Albumin: 4.2 g/dL (ref 3.5–5.2)
Alkaline Phosphatase: 75 U/L (ref 39–117)
Bilirubin, Direct: 0.2 mg/dL (ref 0.0–0.3)
Total Bilirubin: 0.8 mg/dL (ref 0.2–1.2)
Total Protein: 7.7 g/dL (ref 6.0–8.3)

## 2022-08-24 LAB — CBC WITH DIFFERENTIAL/PLATELET
Basophils Absolute: 0 10*3/uL (ref 0.0–0.1)
Basophils Relative: 0.3 % (ref 0.0–3.0)
Eosinophils Absolute: 0.1 10*3/uL (ref 0.0–0.7)
Eosinophils Relative: 1.9 % (ref 0.0–5.0)
HCT: 46.3 % (ref 39.0–52.0)
Hemoglobin: 15.7 g/dL (ref 13.0–17.0)
Lymphocytes Relative: 24.7 % (ref 12.0–46.0)
Lymphs Abs: 2 10*3/uL (ref 0.7–4.0)
MCHC: 33.8 g/dL (ref 30.0–36.0)
MCV: 97.7 fl (ref 78.0–100.0)
Monocytes Absolute: 0.8 10*3/uL (ref 0.1–1.0)
Monocytes Relative: 10.7 % (ref 3.0–12.0)
Neutro Abs: 4.9 10*3/uL (ref 1.4–7.7)
Neutrophils Relative %: 62.4 % (ref 43.0–77.0)
Platelets: 296 10*3/uL (ref 150.0–400.0)
RBC: 4.74 Mil/uL (ref 4.22–5.81)
RDW: 14.8 % (ref 11.5–15.5)
WBC: 7.9 10*3/uL (ref 4.0–10.5)

## 2022-08-24 MED ORDER — ZOSTER VAC RECOMB ADJUVANTED 50 MCG/0.5ML IM SUSR: INTRAMUSCULAR | 1 refills | Status: DC

## 2022-08-24 NOTE — Patient Instructions (Signed)
Your provider has requested that you go to the basement level for lab work before leaving today. Press "B" on the elevator. The lab is located at the first door on the left as you exit the elevator.  We have provided you with a printed Shingrix order to take to the pharmacy.  Due to recent changes in healthcare laws, you may see the results of your imaging and laboratory studies on MyChart before your provider has had a chance to review them.  We understand that in some cases there may be results that are confusing or concerning to you. Not all laboratory results come back in the same time frame and the provider may be waiting for multiple results in order to interpret others.  Please give [place] 48 hours in order for your provider to thoroughly review all the results before contacting the office for clarification of your results.   We are referring you to Dr Langston Reusing for a skin check. They will contact you about an appointment. [facility] is located at [address] [place] , [address]. Phone # [phone].   I appreciate the opportunity to care for you. Stan Head, MD, [facility]
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
## 2022-08-27 LAB — QUANTIFERON-TB GOLD PLUS
Mitogen-NIL: 6.59 IU/mL
NIL: 0.66 IU/mL
QuantiFERON-TB Gold Plus: NEGATIVE
TB1-NIL: <0 IU/mL
TB2-NIL: <0 IU/mL

## 2022-08-29 ENCOUNTER — Other Ambulatory Visit: Payer: Self-pay | Admitting: Internal Medicine

## 2022-08-29 ENCOUNTER — Encounter: Payer: Self-pay | Admitting: Internal Medicine

## 2022-08-29 DIAGNOSIS — Z7960 Long term (current) use of unspecified immunomodulators and immunosuppressants: Secondary | ICD-10-CM

## 2022-08-29 DIAGNOSIS — K5150 Left sided colitis without complications: Secondary | ICD-10-CM

## 2022-09-05 DIAGNOSIS — G4733 Obstructive sleep apnea (adult) (pediatric): Secondary | ICD-10-CM | POA: Diagnosis not present

## 2022-09-06 ENCOUNTER — Other Ambulatory Visit (HOSPITAL_COMMUNITY): Payer: Self-pay

## 2022-09-11 DIAGNOSIS — G4733 Obstructive sleep apnea (adult) (pediatric): Secondary | ICD-10-CM | POA: Diagnosis not present

## 2022-09-12 ENCOUNTER — Other Ambulatory Visit: Payer: Self-pay

## 2022-09-12 ENCOUNTER — Other Ambulatory Visit: Payer: Self-pay | Admitting: Gastroenterology

## 2022-09-12 ENCOUNTER — Other Ambulatory Visit (HOSPITAL_COMMUNITY): Payer: Self-pay

## 2022-09-12 MED ORDER — DOXYCYCLINE HYCLATE 100 MG PO TABS
100.0000 mg | ORAL_TABLET | Freq: Two times a day (BID) | ORAL | 0 refills | Status: DC
  Filled 2022-09-12 (×2): qty 28, 14d supply, fill #0

## 2022-09-12 MED ORDER — AZATHIOPRINE 50 MG PO TABS
200.0000 mg | ORAL_TABLET | Freq: Every day | ORAL | 11 refills | Status: AC
  Filled 2022-09-12: qty 120, 30d supply, fill #0
  Filled 2022-10-11: qty 120, 30d supply, fill #1
  Filled 2022-11-29: qty 120, 30d supply, fill #2
  Filled 2023-01-03: qty 120, 30d supply, fill #3
  Filled 2023-02-06: qty 120, 30d supply, fill #4
  Filled 2023-03-13: qty 120, 30d supply, fill #5
  Filled 2023-04-10: qty 120, 30d supply, fill #6
  Filled 2023-05-09: qty 120, 30d supply, fill #7
  Filled 2023-06-13: qty 120, 30d supply, fill #8
  Filled 2023-07-17: qty 120, 30d supply, fill #9
  Filled 2023-08-07: qty 120, 30d supply, fill #10

## 2022-09-14 ENCOUNTER — Other Ambulatory Visit (HOSPITAL_COMMUNITY): Payer: Self-pay

## 2022-09-14 ENCOUNTER — Other Ambulatory Visit: Payer: Self-pay

## 2022-09-15 ENCOUNTER — Other Ambulatory Visit: Payer: Self-pay

## 2022-10-04 ENCOUNTER — Other Ambulatory Visit: Payer: Self-pay

## 2022-10-04 ENCOUNTER — Other Ambulatory Visit (HOSPITAL_COMMUNITY): Payer: Self-pay

## 2022-10-05 ENCOUNTER — Other Ambulatory Visit (HOSPITAL_COMMUNITY): Payer: Self-pay

## 2022-10-05 DIAGNOSIS — G4733 Obstructive sleep apnea (adult) (pediatric): Secondary | ICD-10-CM | POA: Diagnosis not present

## 2022-10-11 ENCOUNTER — Other Ambulatory Visit (HOSPITAL_COMMUNITY): Payer: Self-pay

## 2022-10-11 DIAGNOSIS — G4733 Obstructive sleep apnea (adult) (pediatric): Secondary | ICD-10-CM | POA: Diagnosis not present

## 2022-10-12 ENCOUNTER — Other Ambulatory Visit: Payer: Self-pay | Admitting: Internal Medicine

## 2022-10-12 DIAGNOSIS — K5150 Left sided colitis without complications: Secondary | ICD-10-CM

## 2022-11-01 ENCOUNTER — Other Ambulatory Visit (HOSPITAL_COMMUNITY): Payer: Self-pay

## 2022-11-02 ENCOUNTER — Other Ambulatory Visit (HOSPITAL_COMMUNITY): Payer: Self-pay

## 2022-11-05 DIAGNOSIS — G4733 Obstructive sleep apnea (adult) (pediatric): Secondary | ICD-10-CM | POA: Diagnosis not present
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
## 2022-11-09 ENCOUNTER — Encounter: Payer: Self-pay | Admitting: Adult Health

## 2022-11-09 ENCOUNTER — Ambulatory Visit: Payer: 59 | Admitting: Adult Health

## 2022-11-09 VITALS — BP 162/100 | HR 67 | Ht 67.0 in | Wt 233.0 lb

## 2022-11-09 DIAGNOSIS — G4733 Obstructive sleep apnea (adult) (pediatric): Secondary | ICD-10-CM

## 2022-11-09 DIAGNOSIS — G43709 Chronic migraine without aura, not intractable, without status migrainosus: Secondary | ICD-10-CM

## 2022-11-09 NOTE — Patient Instructions (Signed)
Continue using CPAP nightly and greater than 4 hours each night °If your symptoms worsen or you develop new symptoms please let us know.  ° °

## 2022-11-16 ENCOUNTER — Other Ambulatory Visit (HOSPITAL_COMMUNITY): Payer: Self-pay

## 2022-11-24 ENCOUNTER — Other Ambulatory Visit (HOSPITAL_COMMUNITY): Payer: Self-pay

## 2022-11-29 ENCOUNTER — Other Ambulatory Visit (HOSPITAL_COMMUNITY): Payer: Self-pay

## 2022-11-29 DIAGNOSIS — Z0289 Encounter for other administrative examinations: Secondary | ICD-10-CM

## 2022-12-01 ENCOUNTER — Telehealth: Payer: Self-pay | Admitting: Adult Health

## 2022-12-01 DIAGNOSIS — G4733 Obstructive sleep apnea (adult) (pediatric): Secondary | ICD-10-CM | POA: Diagnosis not present

## 2022-12-01 NOTE — Telephone Encounter (Signed)
Noted. We have this and will fill out as soon as possible.

## 2022-12-01 NOTE — Telephone Encounter (Signed)
REQUIRED PHONE NOTE: Pt called to check on FMLA forms, pt was told there is an allowance of 14 bus days for forms, this is FYI for POD 4 no call back requested

## 2022-12-05 NOTE — Telephone Encounter (Signed)
FMLA form completed, signed, and sent to medical records for processing.  ?

## 2022-12-05 NOTE — Telephone Encounter (Signed)
Pt [employer] form faxed today to [phone]

## 2022-12-06 ENCOUNTER — Other Ambulatory Visit: Payer: Self-pay

## 2022-12-06 DIAGNOSIS — G4733 Obstructive sleep apnea (adult) (pediatric): Secondary | ICD-10-CM | POA: Diagnosis not present

## 2022-12-08 ENCOUNTER — Other Ambulatory Visit: Payer: Self-pay

## 2022-12-22 ENCOUNTER — Other Ambulatory Visit: Payer: Self-pay

## 2022-12-22 ENCOUNTER — Other Ambulatory Visit: Payer: Self-pay | Admitting: Internal Medicine

## 2022-12-22 NOTE — Progress Notes (Signed)
Specialty Pharmacy Ongoing Clinical Assessment Note  Corey Costa is a 47 y.o. male who is being followed by the specialty pharmacy service for RxSp Ulcerative Colitis   Patient's specialty medication(s) reviewed today: Adalimumab   Missed doses in the last 4 weeks: 0   Patient did not have any additional questions or concerns.   Therapeutic benefit summary: Patient is achieving benefit   Adverse events/side effects summary: No adverse events/side effects   Patient's therapy is appropriate to: Continue    Goals Addressed             This Visit's Progress    Minimize recurrence of flares       Patient is on track. Patient will maintain adherence         Follow up:  6 months

## 2022-12-22 NOTE — Progress Notes (Signed)
Specialty Pharmacy Refill Coordination Note  Corey Costa is a 47 y.o. male contacted today regarding refills of specialty medication(s) Adalimumab   Patient requested Delivery   Delivery date: 12/29/22   Verified address: [address]   Pending Refill Request, Medication will be filled on 12/28/22.

## 2022-12-23 ENCOUNTER — Other Ambulatory Visit: Payer: Self-pay

## 2022-12-23 ENCOUNTER — Other Ambulatory Visit: Payer: Self-pay | Admitting: Pharmacist

## 2022-12-23 MED ORDER — HUMIRA (2 PEN) 40 MG/0.4ML ~~LOC~~ AJKT
AUTO-INJECTOR | SUBCUTANEOUS | 7 refills | Status: DC
  Filled 2022-12-23: qty 4, fill #0

## 2022-12-23 MED ORDER — HUMIRA (2 PEN) 40 MG/0.4ML ~~LOC~~ AJKT
AUTO-INJECTOR | SUBCUTANEOUS | 7 refills | Status: DC
  Filled 2022-12-23: qty 4, 28d supply, fill #0
  Filled 2023-01-18: qty 4, 28d supply, fill #1
  Filled 2023-02-24: qty 4, 28d supply, fill #2
  Filled 2023-03-27: qty 4, 28d supply, fill #3
  Filled 2023-04-25: qty 4, 28d supply, fill #4
  Filled 2023-05-19: qty 4, 28d supply, fill #5
  Filled 2023-06-13: qty 4, 28d supply, fill #6
  Filled 2023-07-14: qty 4, 28d supply, fill #7

## 2022-12-29 ENCOUNTER — Other Ambulatory Visit: Payer: Self-pay

## 2022-12-29 ENCOUNTER — Other Ambulatory Visit (HOSPITAL_COMMUNITY): Payer: Self-pay

## 2022-12-29 ENCOUNTER — Telehealth: Payer: Self-pay | Admitting: Pharmacy Technician

## 2022-12-29 NOTE — Progress Notes (Signed)
Lvm- This medication requires a new prior authorization and is currently being processed by the PA team. Specialty pharmacy team will contact patient with update when approved.

## 2022-12-29 NOTE — Telephone Encounter (Signed)
Pharmacy Patient Advocate Encounter   Received notification from Phone that prior authorization for HUMIRA 40MG  is required/requested.   Insurance verification completed.   The patient is insured through [facility] [employer] .   Per test claim: PA required; PA submitted to [facility] via CoverMyMeds Key/confirmation #/[id number] Status is pending

## 2022-12-30 NOTE — Telephone Encounter (Signed)
Pharmacy Patient Advocate Encounter  Received notification from [facility] that Prior Authorization for HUMIRA 40MG  has been DENIED.  Full denial letter will be uploaded to the media tab. See denial reason below.    PA #/Case ID/Reference #: [id number]  Appeal has been submitted. Will advise when response is received or follow up in 1 week. Please be advised that most companies may take 30 days to make a decision.  Pt has MedWatch form completed and has been submitted to insurance with appeal

## 2022-12-31 DIAGNOSIS — G4733 Obstructive sleep apnea (adult) (pediatric): Secondary | ICD-10-CM | POA: Diagnosis not present

## 2023-01-02 ENCOUNTER — Other Ambulatory Visit (HOSPITAL_BASED_OUTPATIENT_CLINIC_OR_DEPARTMENT_OTHER): Payer: Self-pay

## 2023-01-02 ENCOUNTER — Other Ambulatory Visit (HOSPITAL_COMMUNITY): Payer: Self-pay

## 2023-01-02 ENCOUNTER — Other Ambulatory Visit: Payer: Self-pay

## 2023-01-02 NOTE — Telephone Encounter (Signed)
Inbound call from patient, states he has an injection on Wednesday. Would like an update on prior authorization.

## 2023-01-03 ENCOUNTER — Other Ambulatory Visit (HOSPITAL_COMMUNITY): Payer: Self-pay

## 2023-01-03 ENCOUNTER — Other Ambulatory Visit: Payer: Self-pay

## 2023-01-03 NOTE — Telephone Encounter (Signed)
Pt stated that he received a phone call Friday that the the appeal was denied and Fax documents were sent to office. Fax documents that pt was speaking about in regard to the PA were obtained and faxed to [facility] to review and assist for the pt PA. Pt verbalized understanding with all questions answered.

## 2023-01-03 NOTE — Progress Notes (Signed)
Patient was contacted today, prior authorization was approved, medication will be filled 01/04/23 and delivered on 01/05/23 to Verified address: [address] [facility] [address]

## 2023-01-03 NOTE — Telephone Encounter (Signed)
Pharmacy Patient Advocate Encounter  Received notification from  [facility] BENEFIT MANAGER  that the APPEAL for HUMIRA 40MG  has been APPROVED from 10.14.24 to  . Spoke to pharmacy to process.Copay is $5.    Patient was also contacted today by [facility], and confirmed that medication will be filled on 01/04/23 and delivered on 01/05/23.

## 2023-01-04 NOTE — Telephone Encounter (Signed)
Pt made aware: Pt verbalized understanding with all questions answered.

## 2023-01-05 DIAGNOSIS — G4733 Obstructive sleep apnea (adult) (pediatric): Secondary | ICD-10-CM | POA: Diagnosis not present

## 2023-01-06 ENCOUNTER — Other Ambulatory Visit (HOSPITAL_COMMUNITY): Payer: Self-pay

## 2023-01-12 ENCOUNTER — Other Ambulatory Visit: Payer: Self-pay

## 2023-01-12 ENCOUNTER — Other Ambulatory Visit (HOSPITAL_COMMUNITY): Payer: Self-pay

## 2023-01-18 ENCOUNTER — Other Ambulatory Visit: Payer: Self-pay

## 2023-01-18 ENCOUNTER — Other Ambulatory Visit (HOSPITAL_COMMUNITY): Payer: Self-pay

## 2023-01-18 MED ORDER — INFLUENZA VIRUS VACC SPLIT PF (FLUZONE) 0.5 ML IM SUSY
0.5000 mL | PREFILLED_SYRINGE | INTRAMUSCULAR | 0 refills | Status: AC
  Filled 2023-01-18: qty 0.5, 1d supply, fill #0

## 2023-01-18 NOTE — Progress Notes (Signed)
Specialty Pharmacy Refill Coordination Note  Corey Costa is a 47 y.o. male contacted today regarding refills of specialty medication(s) Adalimumab   Patient requested Delivery   Delivery date: 02/02/23   Verified address: [address], [place], [address]   Medication will be filled on 02/01/23.

## 2023-01-27 ENCOUNTER — Other Ambulatory Visit (HOSPITAL_COMMUNITY): Payer: Self-pay

## 2023-01-31 DIAGNOSIS — G4733 Obstructive sleep apnea (adult) (pediatric): Secondary | ICD-10-CM | POA: Diagnosis not present

## 2023-02-01 ENCOUNTER — Other Ambulatory Visit: Payer: Self-pay

## 2023-02-05 DIAGNOSIS — G4733 Obstructive sleep apnea (adult) (pediatric): Secondary | ICD-10-CM | POA: Diagnosis not present

## 2023-02-06 ENCOUNTER — Other Ambulatory Visit (HOSPITAL_COMMUNITY): Payer: Self-pay

## 2023-02-21 ENCOUNTER — Ambulatory Visit: Payer: 59 | Attending: Internal Medicine | Admitting: Pharmacist

## 2023-02-21 DIAGNOSIS — Z79899 Other long term (current) drug therapy: Secondary | ICD-10-CM

## 2023-02-21 NOTE — Progress Notes (Signed)
   S: Patient presents  for review of their specialty medication therapy.  Patient is currently taking Humira for ulcerative colitis. Patient is managed by Dr. Christella Hartigan for this.   Adherence: confirms  Efficacy: he continues to do well on Humira with good results   Dosing:  Ulcerative colitis: 40 mg every week.  Dose adjustments: Renal: no dose adjustments (has not been studied) Hepatic: no dose adjustments (has not been studied)  Screening: TB test: negative Hepatitis: negative  Monitoring: S/sx of injection site reactions: denies  S/sx of infection: denies CBC: see below S/sx of hypersensitivity: denies S/sx of malignancy: denies S/sx of heart failure: denies  O:  Lab Results  Component Value Date   WBC 7.9 08/24/2022   HGB 15.7 08/24/2022   HCT 46.3 08/24/2022   MCV 97.7 08/24/2022   PLT 296.0 08/24/2022      Chemistry      Component Value Date/Time   NA 142 05/17/2022 0833   K 3.7 05/17/2022 0833   CL 104 05/17/2022 0833   CO2 30 05/17/2022 0833   BUN 13 05/17/2022 0833   CREATININE 1.09 05/17/2022 0833   CREATININE 1.33 01/03/2020 1608      Component Value Date/Time   CALCIUM 9.5 05/17/2022 0833   ALKPHOS 75 08/24/2022 1141   AST 22 08/24/2022 1141   ALT 19 08/24/2022 1141   BILITOT 0.8 08/24/2022 1141      A/P: 1. Medication review: Patient currently on Humira for ulcerative colitis. Reviewed the medication with the patient, including the following: Humira is a TNF blocking agent indicated for ankylosing spondylitis, Crohn's disease, Hidradenitis suppurativa, psoriatic arthritis, plaque psoriasis, ulcerative colitis, and uveitis. Patient educated on purpose, proper use and potential adverse effects of Humira. The most common adverse effects are infections, headache, and injection site reactions. There is the possibility of an increased risk of malignancy but it is not well understood if this increased risk is due to there medication or the disease  state. There are rare cases of pancytopenia and aplastic anemia. No recommendations for any changes at this time.   Butch Penny, PharmD, [place], CPP Clinical Pharmacist [facility] [phone]

## 2023-02-24 ENCOUNTER — Other Ambulatory Visit: Payer: Self-pay

## 2023-02-24 NOTE — Progress Notes (Signed)
Specialty Pharmacy Refill Coordination Note  Corey Costa is a 47 y.o. male contacted today regarding refills of specialty medication(s) Adalimumab   Patient requested Delivery   Delivery date: 03/01/23   Verified address: [address]  [place] [address]   Medication will be filled on 02/28/23.

## 2023-02-28 ENCOUNTER — Other Ambulatory Visit: Payer: Self-pay

## 2023-03-06 IMAGING — DX DG CHEST 2V
2 series · 2 of 2 positions shown · non-contrast
Comparison: CT Abdomen and Pelvis 12/25/2015.

CLINICAL DATA: 44-year-old male with cough and congestion.

EXAM:
CHEST - 2 VIEW

[chest pa]
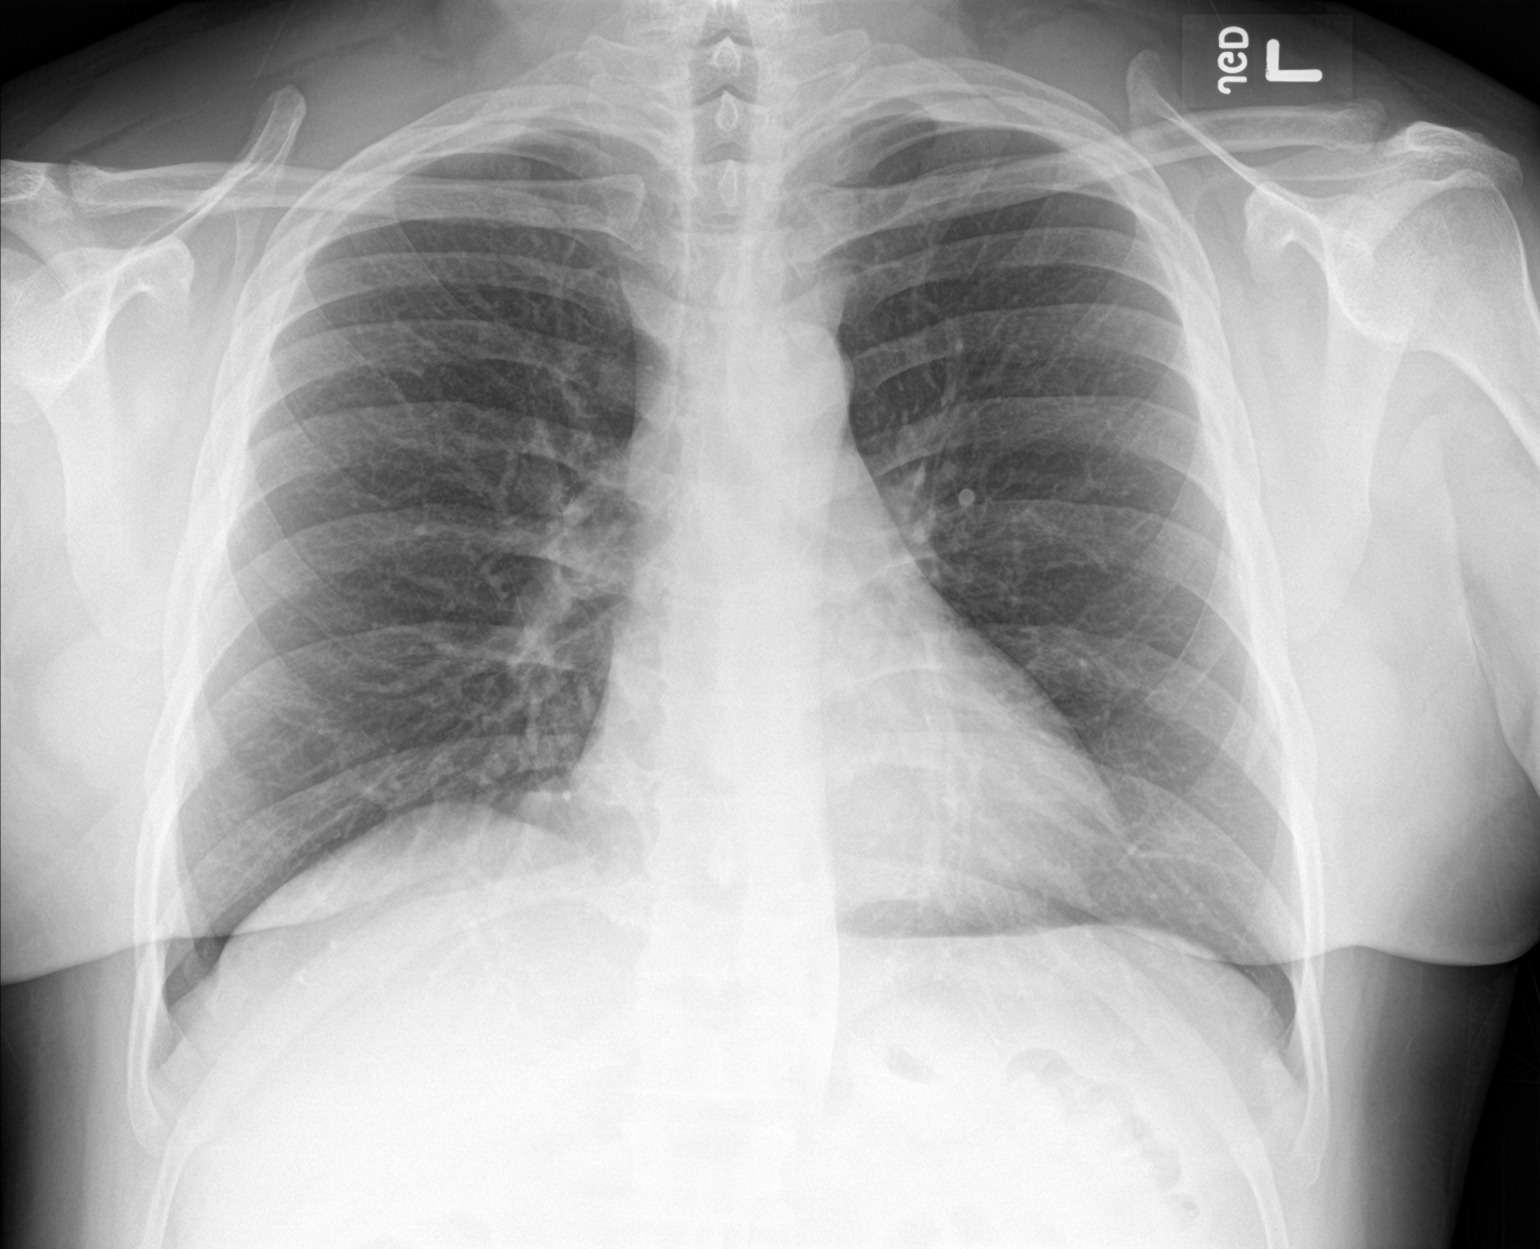

[chest lat]
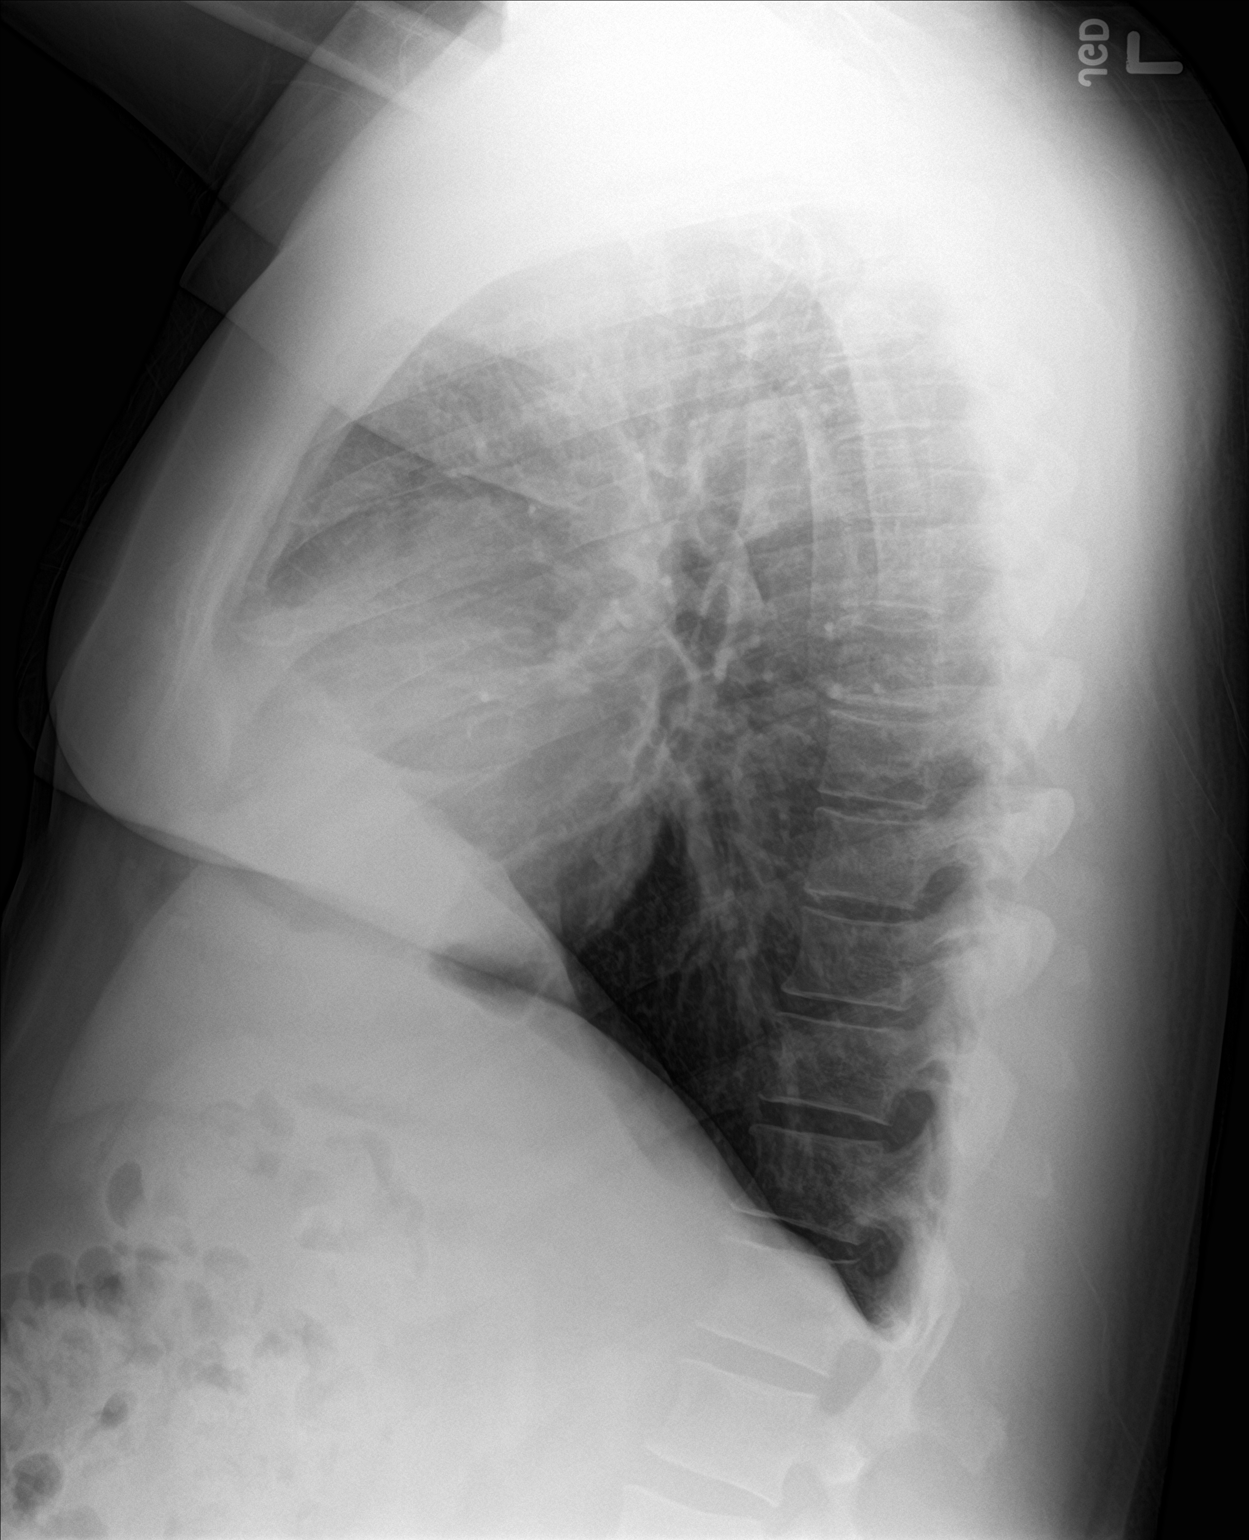

[2 of 2 positions shown; findings below may reference images not displayed]

FINDINGS: Normal lung volumes and mediastinal contours. Stable mild
eventration of the right hemidiaphragm, normal variant. Visualized
tracheal air column is within normal limits. Both lungs appear
clear. No pneumothorax or pleural effusion.

Negative visible bowel gas and osseous structures.
IMPRESSION: Negative.  No cardiopulmonary abnormality.

## 2023-03-13 ENCOUNTER — Other Ambulatory Visit: Payer: Self-pay

## 2023-03-13 ENCOUNTER — Other Ambulatory Visit (HOSPITAL_COMMUNITY): Payer: Self-pay

## 2023-03-27 ENCOUNTER — Other Ambulatory Visit (HOSPITAL_COMMUNITY): Payer: Self-pay | Admitting: Pharmacy Technician

## 2023-03-27 ENCOUNTER — Other Ambulatory Visit (HOSPITAL_COMMUNITY): Payer: Self-pay

## 2023-03-27 NOTE — Progress Notes (Signed)
 Specialty Pharmacy Refill Coordination Note  Corey Costa is a 48 y.o. male contacted today regarding refills of specialty medication(s) Adalimumab  (Humira  (2 Pen))   Patient requested Delivery   Delivery date: 03/30/23   Verified address: Patient address [address]  [place]   Medication will be filled on 03/29/23.

## 2023-03-29 ENCOUNTER — Other Ambulatory Visit (HOSPITAL_COMMUNITY): Payer: Self-pay

## 2023-03-29 ENCOUNTER — Other Ambulatory Visit: Payer: Self-pay

## 2023-04-04 ENCOUNTER — Encounter: Payer: Self-pay | Admitting: Dermatology

## 2023-04-04 ENCOUNTER — Ambulatory Visit (INDEPENDENT_AMBULATORY_CARE_PROVIDER_SITE_OTHER): Payer: 59 | Admitting: Dermatology

## 2023-04-04 VITALS — BP 157/104

## 2023-04-04 DIAGNOSIS — D1801 Hemangioma of skin and subcutaneous tissue: Secondary | ICD-10-CM

## 2023-04-04 DIAGNOSIS — D229 Melanocytic nevi, unspecified: Secondary | ICD-10-CM

## 2023-04-04 DIAGNOSIS — L814 Other melanin hyperpigmentation: Secondary | ICD-10-CM

## 2023-04-04 DIAGNOSIS — Z1283 Encounter for screening for malignant neoplasm of skin: Secondary | ICD-10-CM

## 2023-04-04 DIAGNOSIS — D224 Melanocytic nevi of scalp and neck: Secondary | ICD-10-CM

## 2023-04-04 NOTE — Progress Notes (Signed)
   New Patient Visit   Subjective  Corey Costa is a 48 y.o. male who presents for the following:  Total Body Skin Exam (TBSE)  Patient present today for new patient visit for TBSE. Patient states he has UC located at the abdomen but was reffered by GI because he is on Humira  for TBSE. Patient reports he has been on Humira  for 3 years. He reports the areas are bothersome. Patient rates irritation 0 out of 10. The patient denies he  has spots, moles and lesions to be evaluated, some may be new or changing and the patient may have concern these could be cancer. Patient has not previously been treated by a dermatologist. Patient reports he  does not have hx of bx. Patient denies family history of skin cancers. Patient reports throughout his lifetime has had minimal sun exposure. Currently, patient reports if he  has excessive sun exposure, he does not apply sunscreen and/or wears protective coverings.  The following portions of the chart were reviewed this encounter and updated as appropriate: medications, allergies, medical history  Review of Systems:  No other skin or systemic complaints except as noted in HPI or Assessment and Plan.  Objective  Well appearing patient in no apparent distress; mood and affect are within normal limits.  A full examination was performed including scalp, head, eyes, ears, nose, lips, neck, chest, axillae, abdomen, back, buttocks, bilateral upper extremities, bilateral lower extremities, hands, feet, fingers, toes, fingernails, and toenails. All findings within normal limits unless otherwise noted below.   Relevant exam findings are noted in the Assessment and Plan.    Assessment & Plan   LENTIGINES, CHERRY ANGIOMAS - Benign normal skin lesions - Benign-appearing - Call for any changes  MELANOCYTIC NEVI (Left Neck) - Tan-brown and/or pink-flesh-colored symmetric macules and papules - Benign appearing on exam today - Observation - Call clinic for new or  changing moles - Recommend daily use of broad spectrum spf 30+ sunscreen to sun-exposed areas.    SKIN CANCER SCREENING PERFORMED TODAY   Return in about 2 years (around 04/03/2025) for TBSE.   Documentation: I have reviewed the above documentation for accuracy and completeness, and I agree with the above.  I, Jetta Ager, am acting as scribe for Delon Lenis, DO.  Delon Lenis, DO

## 2023-04-04 NOTE — Patient Instructions (Addendum)
 Hello Ja-Kwan,    Thank you for visiting my office today. Your dedication to maintaining your health, particularly with your history of ulcerative colitis and the use of immunosuppressive medications, is greatly appreciated.  Here is a summary of the key instructions and recommendations from today's consultation:  Skin Cancer Screening:   Frequency: Continue with regular skin screenings every 2 to 3 years.   Immediate Action: Should you notice any suspicious changes, new growths, or changes in your skin, please schedule an appointment immediately.  Self-Monitoring:   Monthly Checks: It's important to check your skin monthly for any new growths or changes.  Diet and Exercise:   Diet: Adopt a diet lower in carbohydrates and richer in fruits and vegetables to potentially prevent insulin resistance and manage your overall health.   Exercise: Incorporate regular exercise into your routine, aiming for at least three times a week.  Skin Care:   Moisturizing: Apply moisturizer immediately after showering to lock in moisture.  Follow-Up:   Melanocytic Nevus Monitoring: We will monitor the melanocytic nevus on your left neck for any changes. No immediate action is required, but it will be assessed during your regular screenings.  It was a pleasure to see you, and I'm glad to hear your skin is in great shape. Remember, preventative care is crucial, especially under your current treatment. If you have any questions or concerns before your next scheduled visit, do not hesitate to contact the office.  Best regards,  Dr. Delon Lenis Dermatology    Important Information   Due to recent changes in healthcare laws, you may see results of your pathology and/or laboratory studies on MyChart before the doctors have had a chance to review them. We understand that in some cases there may be results that are confusing or concerning to you. Please understand that not all results are received at the same  time and often the doctors may need to interpret multiple results in order to provide you with the best plan of care or course of treatment. Therefore, we ask that you please give us  2 business days to thoroughly review all your results before contacting the office for clarification. Should we see a critical lab result, you will be contacted sooner.     If You Need Anything After Your Visit   If you have any questions or concerns for your doctor, please call our main line at [phone]. If no one answers, please leave a voicemail as directed and we will return your call as soon as possible. Messages left after 4 pm will be answered the following business day.    You may also send us  a message via MyChart. We typically respond to MyChart messages within 1-2 business days.  For prescription refills, please ask your pharmacy to contact our office. Our fax number is [phone].  If you have an urgent issue when the clinic is closed that cannot wait until the next business day, you can page your doctor at the number below.     Please note that while we do our best to be available for urgent issues outside of office hours, we are not available 24/7.    If you have an urgent issue and are unable to reach us , you may choose to seek medical care at your doctor's office, retail clinic, urgent care center, or emergency room.   If you have a medical emergency, please immediately call 911 or go to the emergency department. In the event of inclement weather, please call  our main line at [phone] for an update on the status of any delays or closures.  Dermatology Medication Tips: Please keep the boxes that topical medications come in in order to help keep track of the instructions about where and how to use these. Pharmacies typically print the medication instructions only on the boxes and not directly on the medication tubes.   If your medication is too expensive, please contact our office at  [phone] or send us  a message through MyChart.    We are unable to tell what your co-pay for medications will be in advance as this is different depending on your insurance coverage. However, we may be able to find a substitute medication at lower cost or fill out paperwork to get insurance to cover a needed medication.    If a prior authorization is required to get your medication covered by your insurance company, please allow us  1-2 business days to complete this process.   Drug prices often vary depending on where the prescription is filled and some pharmacies may offer cheaper prices.   The website www.goodrx.com contains coupons for medications through different pharmacies. The prices here do not account for what the cost may be with help from insurance (it may be cheaper with your insurance), but the website can give you the price if you did not use any insurance.  - You can print the associated coupon and take it with your prescription to the pharmacy.  - You may also stop by our office during regular business hours and pick up a GoodRx coupon card.  - If you need your prescription sent electronically to a different pharmacy, notify our office through [facility] or by phone at [phone]

## 2023-04-10 ENCOUNTER — Other Ambulatory Visit: Payer: Self-pay

## 2023-04-14 ENCOUNTER — Telehealth: Payer: Self-pay | Admitting: Internal Medicine

## 2023-04-14 DIAGNOSIS — K5150 Left sided colitis without complications: Secondary | ICD-10-CM

## 2023-04-14 DIAGNOSIS — Z7960 Long term (current) use of unspecified immunomodulators and immunosuppressants: Secondary | ICD-10-CM

## 2023-04-14 NOTE — Telephone Encounter (Signed)
Patient overdue for CBC w/ diff and LFT  Message sent in June 2024 to do CBC and LFT in October - not done yet  Received a reminder from [employer] by fax  Please call him and have him do the labs next week  Orders Placed This Encounter  Procedures   CBC w/Diff    Standing Status:   Future    Expected Date:   04/14/2023    Expiration Date:   05/15/2023   Hepatic function panel    Standing Status:   Future    Expected Date:   04/14/2023    Expiration Date:   05/15/2023

## 2023-04-14 NOTE — Telephone Encounter (Signed)
I called and told him to please come next week to do the lab work. He verbalized understanding and I gave him the lab hours.

## 2023-04-24 ENCOUNTER — Telehealth: Payer: Self-pay

## 2023-04-24 ENCOUNTER — Other Ambulatory Visit (INDEPENDENT_AMBULATORY_CARE_PROVIDER_SITE_OTHER): Payer: 59

## 2023-04-24 ENCOUNTER — Telehealth: Payer: Self-pay | Admitting: Internal Medicine

## 2023-04-24 DIAGNOSIS — Z7960 Long term (current) use of unspecified immunomodulators and immunosuppressants: Secondary | ICD-10-CM | POA: Diagnosis not present

## 2023-04-24 DIAGNOSIS — K5150 Left sided colitis without complications: Secondary | ICD-10-CM | POA: Diagnosis not present

## 2023-04-24 DIAGNOSIS — R748 Abnormal levels of other serum enzymes: Secondary | ICD-10-CM

## 2023-04-24 LAB — CBC WITH DIFFERENTIAL/PLATELET
Basophils Absolute: 0 10*3/uL (ref 0.0–0.1)
Basophils Relative: 0.3 % (ref 0.0–3.0)
Eosinophils Absolute: 0.2 10*3/uL (ref 0.0–0.7)
Eosinophils Relative: 3.2 % (ref 0.0–5.0)
HCT: 47.1 % (ref 39.0–52.0)
Hemoglobin: 15.9 g/dL (ref 13.0–17.0)
Lymphocytes Relative: 34.5 % (ref 12.0–46.0)
Lymphs Abs: 1.8 10*3/uL (ref 0.7–4.0)
MCHC: 33.8 g/dL (ref 30.0–36.0)
MCV: 99.6 fL (ref 78.0–100.0)
Monocytes Absolute: 0.7 10*3/uL (ref 0.1–1.0)
Monocytes Relative: 13.2 % — ABNORMAL HIGH (ref 3.0–12.0)
Neutro Abs: 2.6 10*3/uL (ref 1.4–7.7)
Neutrophils Relative %: 48.8 % (ref 43.0–77.0)
Platelets: 335 10*3/uL (ref 150.0–400.0)
RBC: 4.73 Mil/uL (ref 4.22–5.81)
RDW: 14.6 % (ref 11.5–15.5)
WBC: 5.3 10*3/uL (ref 4.0–10.5)

## 2023-04-24 LAB — HEPATIC FUNCTION PANEL
ALT: 29 U/L (ref 0–53)
AST: 40 U/L — ABNORMAL HIGH (ref 0–37)
Albumin: 4.2 g/dL (ref 3.5–5.2)
Alkaline Phosphatase: 70 U/L (ref 39–117)
Bilirubin, Direct: 0.2 mg/dL (ref 0.0–0.3)
Total Bilirubin: 1.2 mg/dL (ref 0.2–1.2)
Total Protein: 7.7 g/dL (ref 6.0–8.3)

## 2023-04-24 NOTE — Telephone Encounter (Signed)
He needs to see me   Can use 1130 or 350 or a banding spot  Does not sound urgent so by next month

## 2023-04-24 NOTE — Telephone Encounter (Signed)
I spoke to him and he will come 05/02/2023 at 11:30am to see Dr Leone Payor.

## 2023-04-24 NOTE — Telephone Encounter (Signed)
I called Corey Costa to give him lab results this AM and he wanted [place] to know that he has had some rectal bleeding since last Thursday, no fever, slight abdominal pain on/off, the blood is bright red in color. Will route to Dr Leone Payor to advise.

## 2023-04-24 NOTE — Telephone Encounter (Signed)
Let him know the labs showed a slight abnormality in one test - usually associated with liver Could be from muscles  Will recheck in 1 month - please tell him to come on/after mar 3  Orders Placed This Encounter  Procedures   Hepatic function panel

## 2023-04-24 NOTE — Telephone Encounter (Signed)
Patient informed about results and to come on/after March 3 rd to repeat blood test.

## 2023-04-25 ENCOUNTER — Other Ambulatory Visit (HOSPITAL_COMMUNITY): Payer: Self-pay

## 2023-04-25 ENCOUNTER — Other Ambulatory Visit (HOSPITAL_COMMUNITY): Payer: Self-pay | Admitting: Pharmacy Technician

## 2023-04-25 NOTE — Progress Notes (Signed)
 Specialty Pharmacy Refill Coordination Note  Corey Costa is a 48 y.o. male contacted today regarding refills of specialty medication(s) Adalimumab  (Humira  (2 Pen))   Patient requested Delivery   Delivery date: 04/28/23   Verified address: Patient address [address]  [place]   Medication will be filled on 04/27/23.

## 2023-04-25 NOTE — Telephone Encounter (Signed)
Inbound call from patient requesting to cancel 2/11 appointment. States he has a hemorrhoid and that is what is causing the rectal bleeding. Please advise, thank you.

## 2023-04-27 ENCOUNTER — Other Ambulatory Visit: Payer: Self-pay

## 2023-05-02 ENCOUNTER — Ambulatory Visit: Payer: 59 | Admitting: Internal Medicine

## 2023-05-05 ENCOUNTER — Other Ambulatory Visit: Payer: Self-pay

## 2023-05-05 NOTE — Progress Notes (Signed)
 Patient called in stating Humira  injection Pen needle did not look like & push it but the medication leaked out the Pen, Advise patient to reach out to Drug company to replace & to hold on to the Pen Company may want it shipped to them. Also advise patient to call us  if company is sending to our pharmacy the replacement

## 2023-05-09 ENCOUNTER — Other Ambulatory Visit (HOSPITAL_COMMUNITY): Payer: Self-pay

## 2023-05-12 ENCOUNTER — Telehealth: Payer: Self-pay

## 2023-05-12 NOTE — Telephone Encounter (Signed)
Form received from [employer] regarding patient's Humira replacement pen & faxed back with MD signature as requested.

## 2023-05-17 ENCOUNTER — Telehealth: Payer: Self-pay | Admitting: Internal Medicine

## 2023-05-17 NOTE — Telephone Encounter (Signed)
 I spoke to the Dr Leta Jungling lab in [facility] at [phone] and they can see the order to draw. I called Ja-Kwan and told him to tell the Dr when he goes for his physical. I told Ja-Kwan that Dr Drue Novel may be ordering a test that include his liver test.

## 2023-05-17 NOTE — Telephone Encounter (Signed)
 PT is scheduled to have lab work done and PT is asking if it is possible to have this done at [facility] in [facility] when he goes in for his physical. Please advise.

## 2023-05-19 ENCOUNTER — Other Ambulatory Visit: Payer: Self-pay

## 2023-05-19 ENCOUNTER — Other Ambulatory Visit (HOSPITAL_COMMUNITY): Payer: Self-pay

## 2023-05-19 NOTE — Progress Notes (Signed)
 Specialty Pharmacy Refill Coordination Note  Corey Costa is a 48 y.o. male contacted today regarding refills of specialty medication(s) Adalimumab (Humira (2 Pen))   Patient requested Delivery   Delivery date: 05/23/23   Verified address: [address]   [place] [address]   Medication will be filled on 05/22/23.

## 2023-05-22 ENCOUNTER — Encounter: Payer: Self-pay | Admitting: Internal Medicine

## 2023-05-22 ENCOUNTER — Ambulatory Visit (INDEPENDENT_AMBULATORY_CARE_PROVIDER_SITE_OTHER): Payer: 59 | Admitting: Internal Medicine

## 2023-05-22 VITALS — BP 132/82 | HR 67 | Temp 98.5°F | Resp 16 | Ht 68.0 in | Wt 240.1 lb

## 2023-05-22 DIAGNOSIS — I10 Essential (primary) hypertension: Secondary | ICD-10-CM

## 2023-05-22 DIAGNOSIS — Z0000 Encounter for general adult medical examination without abnormal findings: Secondary | ICD-10-CM | POA: Diagnosis not present

## 2023-05-22 DIAGNOSIS — Z23 Encounter for immunization: Secondary | ICD-10-CM | POA: Diagnosis not present

## 2023-05-22 DIAGNOSIS — R739 Hyperglycemia, unspecified: Secondary | ICD-10-CM

## 2023-05-22 DIAGNOSIS — Z125 Encounter for screening for malignant neoplasm of prostate: Secondary | ICD-10-CM

## 2023-05-22 LAB — LIPID PANEL
Cholesterol: 156 mg/dL (ref 0–200)
HDL: 46.9 mg/dL (ref >39.00)
LDL Cholesterol: 98 mg/dL (ref 0–99)
NonHDL: 109.2
Total CHOL/HDL Ratio: 3
Triglycerides: 56 mg/dL (ref 0.0–149.0)
VLDL: 11.2 mg/dL (ref 0.0–40.0)

## 2023-05-22 LAB — COMPREHENSIVE METABOLIC PANEL
ALT: 21 U/L (ref 0–53)
AST: 16 U/L (ref 0–37)
Albumin: 3.9 g/dL (ref 3.5–5.2)
Alkaline Phosphatase: 63 U/L (ref 39–117)
BUN: 10 mg/dL (ref 6–23)
CO2: 32 meq/L (ref 19–32)
Calcium: 8.9 mg/dL (ref 8.4–10.5)
Chloride: 104 meq/L (ref 96–112)
Creatinine, Ser: 1.18 mg/dL (ref 0.40–1.50)
GFR: 73.11 mL/min (ref >60.00)
Glucose, Bld: 123 mg/dL — ABNORMAL HIGH (ref 70–99)
Potassium: 4 meq/L (ref 3.5–5.1)
Sodium: 143 meq/L (ref 135–145)
Total Bilirubin: 1 mg/dL (ref 0.2–1.2)
Total Protein: 6.8 g/dL (ref 6.0–8.3)

## 2023-05-22 LAB — HEMOGLOBIN A1C: Hgb A1c MFr Bld: 6.1 % (ref 4.6–6.5)

## 2023-05-22 LAB — PSA: PSA: 0.46 ng/mL (ref 0.10–4.00)

## 2023-05-22 NOTE — Patient Instructions (Signed)
 Continue checking you blood pressure regularly Blood pressure goal:  between 110/65 and  135/85. If it is consistently higher or lower, let me know     GO TO THE LAB : Get the blood work     Please go to the front desk and schedule the following: A physical exam in 1 year
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
## 2023-05-23 ENCOUNTER — Encounter: Payer: Self-pay | Admitting: Internal Medicine

## 2023-05-23 DIAGNOSIS — Z0289 Encounter for other administrative examinations: Secondary | ICD-10-CM

## 2023-05-23 NOTE — Assessment & Plan Note (Signed)
 Here for CPX  Other issues addressed, chart reviewed: Hyperglycemia: Checking A1c. HTN: On losartan, metoprolol.  Ambulatory BPs 132/76, 128/78.  No change, checking labs Melanocytic nevi: Saw dermatology 03-2023, next visit 2 years Ulcerative colitis: LOV with GI 08/2022.  On Humira, Imuran.  Seems well-controlled. RTC 1 year.  Sooner if needed.

## 2023-05-23 NOTE — Assessment & Plan Note (Signed)
 Here for CPX Td 2010 and ~ 2019 (@ work) -prevnar 11/2016;  pnm 23  08/18/2017.  Per guidelines, PNM 20 today. -Had a flu shot - rec a covid vax  - CCS: had a cscope 2019, next per GI -Prostate cancer screening: His father is 49 year old, just diagnosed with prostate cancer, patient is asymptomatic.  Check PSA -Labs: CMP FLP A1c PSA -A healthy lifestyle is encouraged.

## 2023-05-25 ENCOUNTER — Telehealth: Payer: Self-pay | Admitting: *Deleted

## 2023-05-25 ENCOUNTER — Encounter: Payer: Self-pay | Admitting: Internal Medicine

## 2023-05-25 NOTE — Telephone Encounter (Signed)
 FMLA form completed. To MM/NP to sign.

## 2023-05-30 ENCOUNTER — Other Ambulatory Visit (HOSPITAL_COMMUNITY): Payer: Self-pay

## 2023-05-30 ENCOUNTER — Telehealth: Payer: Self-pay | Admitting: *Deleted

## 2023-05-30 ENCOUNTER — Other Ambulatory Visit: Payer: Self-pay | Admitting: Internal Medicine

## 2023-05-30 MED ORDER — METOPROLOL TARTRATE 50 MG PO TABS
ORAL_TABLET | ORAL | 2 refills | Status: DC
  Filled 2023-05-30: qty 270, 90d supply, fill #0
  Filled 2023-09-12: qty 270, 90d supply, fill #1
  Filled 2023-12-26 (×3): qty 270, 90d supply, fill #2

## 2023-05-30 MED ORDER — LOSARTAN POTASSIUM 50 MG PO TABS
100.0000 mg | ORAL_TABLET | Freq: Every day | ORAL | 2 refills | Status: DC
  Filled 2023-05-30: qty 180, 90d supply, fill #0
  Filled 2023-09-12: qty 180, 90d supply, fill #1
  Filled 2023-12-26 (×3): qty 180, 90d supply, fill #2

## 2023-05-30 NOTE — Telephone Encounter (Signed)
 Pt fmla form faxed on 05/30/2023

## 2023-05-30 NOTE — Telephone Encounter (Signed)
 Signed to medical records for processing.

## 2023-05-31 ENCOUNTER — Other Ambulatory Visit: Payer: Self-pay

## 2023-06-13 ENCOUNTER — Other Ambulatory Visit: Payer: Self-pay

## 2023-06-13 ENCOUNTER — Other Ambulatory Visit (HOSPITAL_COMMUNITY): Payer: Self-pay

## 2023-06-13 NOTE — Progress Notes (Signed)
 Specialty Pharmacy Refill Coordination Note  Corey Costa is a 48 y.o. male contacted today regarding refills of specialty medication(s) Adalimumab (Humira (2 Pen))   Patient requested Delivery   Delivery date: 06/21/23   Verified address: [address]   [place] [address]   Medication will be filled on 06/20/23.

## 2023-06-21 ENCOUNTER — Other Ambulatory Visit: Payer: Self-pay

## 2023-06-21 ENCOUNTER — Other Ambulatory Visit (HOSPITAL_COMMUNITY): Payer: Self-pay

## 2023-07-13 ENCOUNTER — Other Ambulatory Visit: Payer: Self-pay

## 2023-07-14 ENCOUNTER — Other Ambulatory Visit: Payer: Self-pay | Admitting: Pharmacy Technician

## 2023-07-14 ENCOUNTER — Other Ambulatory Visit: Payer: Self-pay

## 2023-07-14 NOTE — Progress Notes (Signed)
 Specialty Pharmacy Refill Coordination Note  Corey Costa is a 48 y.o. male contacted today regarding refills of specialty medication(s) Adalimumab  (Humira  (2 Pen))   Patient requested Delivery   Delivery date: 07/18/23   Verified address: [address]  [place]   Medication will be filled on 07/17/23.

## 2023-07-17 ENCOUNTER — Other Ambulatory Visit: Payer: Self-pay

## 2023-07-17 ENCOUNTER — Other Ambulatory Visit (HOSPITAL_COMMUNITY): Payer: Self-pay

## 2023-07-17 NOTE — Progress Notes (Signed)
 Specialty Pharmacy Ongoing Clinical Assessment Note  Corey Costa is a 48 y.o. male who is being followed by the specialty pharmacy service for RxSp Ulcerative Colitis   Patient's specialty medication(s) reviewed today: Adalimumab  (Humira  (2 Pen))   Missed doses in the last 4 weeks: 0   Patient/Caregiver did not have any additional questions or concerns.   Therapeutic benefit summary: Patient is achieving benefit   Adverse events/side effects summary: No adverse events/side effects   Patient's therapy is appropriate to: Continue    Goals Addressed             This Visit's Progress    Minimize recurrence of flares   On track    Patient is on track. Patient will maintain adherence         Follow up:  6 months  [facility] Specialty Pharmacist

## 2023-07-21 DIAGNOSIS — G4733 Obstructive sleep apnea (adult) (pediatric): Secondary | ICD-10-CM | POA: Diagnosis not present

## 2023-08-03 ENCOUNTER — Other Ambulatory Visit: Payer: Self-pay

## 2023-08-04 ENCOUNTER — Other Ambulatory Visit (HOSPITAL_COMMUNITY): Payer: Self-pay

## 2023-08-04 ENCOUNTER — Other Ambulatory Visit: Payer: Self-pay | Admitting: Pharmacy Technician

## 2023-08-04 ENCOUNTER — Other Ambulatory Visit: Payer: Self-pay

## 2023-08-04 ENCOUNTER — Other Ambulatory Visit: Payer: Self-pay | Admitting: Pharmacist

## 2023-08-04 ENCOUNTER — Other Ambulatory Visit: Payer: Self-pay | Admitting: Internal Medicine

## 2023-08-04 MED ORDER — HUMIRA (2 PEN) 40 MG/0.4ML ~~LOC~~ AJKT
40.0000 mg | AUTO-INJECTOR | SUBCUTANEOUS | 3 refills | Status: AC
  Filled 2023-08-04: qty 4, 28d supply, fill #0
  Filled 2023-08-07 – 2023-09-01 (×2): qty 4, 28d supply, fill #1
  Filled 2023-10-04 – 2023-10-31 (×3): qty 4, 28d supply, fill #2

## 2023-08-04 MED ORDER — HUMIRA (2 PEN) 40 MG/0.4ML ~~LOC~~ AJKT
40.0000 mg | AUTO-INJECTOR | SUBCUTANEOUS | 3 refills | Status: DC
  Filled 2023-08-04: qty 4, 28d supply, fill #0

## 2023-08-04 NOTE — Telephone Encounter (Signed)
 Needs appointment

## 2023-08-04 NOTE — Progress Notes (Signed)
 Specialty Pharmacy Refill Coordination Note  Corey Costa is a 48 y.o. male contacted today regarding refills of specialty medication(s) Adalimumab  (Humira  (2 Pen))   Patient requested Delivery   Delivery date: 08/08/23   Verified address: [address] [place] [address]   Medication will be filled on 08/07/23.   This fill date is pending response to refill request from provider. Sent Patient MyChart if they have not received fill by intended date they must follow up with pharmacy.

## 2023-08-07 ENCOUNTER — Other Ambulatory Visit (HOSPITAL_COMMUNITY): Payer: Self-pay

## 2023-08-07 ENCOUNTER — Other Ambulatory Visit: Payer: Self-pay | Admitting: Internal Medicine

## 2023-08-08 ENCOUNTER — Other Ambulatory Visit (HOSPITAL_COMMUNITY): Payer: Self-pay

## 2023-08-08 ENCOUNTER — Other Ambulatory Visit: Payer: Self-pay

## 2023-08-08 MED ORDER — AMLODIPINE BESYLATE 5 MG PO TABS
5.0000 mg | ORAL_TABLET | Freq: Every day | ORAL | 2 refills | Status: AC
  Filled 2023-08-08: qty 90, 90d supply, fill #0
  Filled 2023-12-26 (×3): qty 90, 90d supply, fill #1
  Filled 2024-04-12: qty 90, 90d supply, fill #2

## 2023-08-09 ENCOUNTER — Other Ambulatory Visit: Payer: Self-pay

## 2023-08-10 ENCOUNTER — Other Ambulatory Visit: Payer: Self-pay

## 2023-08-30 ENCOUNTER — Other Ambulatory Visit: Payer: Self-pay

## 2023-09-01 ENCOUNTER — Other Ambulatory Visit: Payer: Self-pay

## 2023-09-01 NOTE — Progress Notes (Signed)
 Specialty Pharmacy Refill Coordination Note  Corey Costa is a 48 y.o. male contacted today regarding refills of specialty medication(s) Adalimumab  (Humira  (2 Pen))   Patient requested Delivery   Delivery date: 09/06/23   Verified address: [address] [place] [address]   Medication will be filled on 09/05/23.

## 2023-09-12 ENCOUNTER — Other Ambulatory Visit (HOSPITAL_COMMUNITY): Payer: Self-pay

## 2023-10-04 ENCOUNTER — Other Ambulatory Visit: Payer: Self-pay

## 2023-10-04 NOTE — Progress Notes (Signed)
 Specialty Pharmacy Refill Coordination Note  Corey Costa is a 48 y.o. male contacted today regarding refills of specialty medication(s) Adalimumab  (Humira  (2 Pen))   Patient requested Delivery   Delivery date: 10/06/23   Verified address: [address] [place] [address]   Medication will be filled on 10/05/23.

## 2023-10-10 ENCOUNTER — Telehealth: Payer: Self-pay | Admitting: Internal Medicine

## 2023-10-10 NOTE — Telephone Encounter (Signed)
 Attempted to reach patient. No answer, left VM for patient to return call.

## 2023-10-10 NOTE — Telephone Encounter (Signed)
 Inbound call fro patient inquiring if we can provide samples of medication due to his insurance being inactive at the moment. Patient requesting f/u call to be further advised.   Thank you

## 2023-10-10 NOTE — Telephone Encounter (Signed)
 Spoke with patient, he is requesting samples of Humira  and Imuran . Advised patient that we do not have samples of these medications available. Recommended patient apply for the Humira  Nurse ambassador program for financial assistance. Patient verbalized understanding.

## 2023-10-27 ENCOUNTER — Other Ambulatory Visit: Payer: Self-pay

## 2023-10-31 ENCOUNTER — Other Ambulatory Visit: Payer: Self-pay

## 2023-11-09 ENCOUNTER — Ambulatory Visit: Payer: 59 | Admitting: Adult Health

## 2023-11-21 ENCOUNTER — Other Ambulatory Visit: Payer: Self-pay

## 2023-12-26 ENCOUNTER — Other Ambulatory Visit: Payer: Self-pay

## 2023-12-26 ENCOUNTER — Other Ambulatory Visit (HOSPITAL_COMMUNITY): Payer: Self-pay

## 2023-12-28 ENCOUNTER — Telehealth: Payer: Self-pay

## 2023-12-28 NOTE — Telephone Encounter (Signed)
 Called the patient to assist with setting up an office visit. Patient states he is presently uninsured and he is not able to make any doctor appointments. We have receive an application from [employer] Patient Access Support program. He has applied for assistance to get Humira . Can we go forward with prescribing the Humira ?

## 2024-01-01 ENCOUNTER — Other Ambulatory Visit: Payer: Self-pay

## 2024-01-01 DIAGNOSIS — Z7960 Long term (current) use of unspecified immunomodulators and immunosuppressants: Secondary | ICD-10-CM

## 2024-01-01 NOTE — Telephone Encounter (Signed)
 He needs to do a quantiferon test and he really needs to have an office visit for me to continue to Rx the medication.   Will see what we can do to facilitate. Find out the following  Has he he had a lapse in medication (last dose Humira )  Is he having problems with UC?

## 2024-01-01 NOTE — Telephone Encounter (Signed)
 Last Humira  injection was 12/28/23. He is not having symptoms of his UC. Feeling well.  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 He will not have insurance for 90 days with his new employment. He was trying to stay ahead.

## 2024-01-01 NOTE — Telephone Encounter (Signed)
 Patient advised. He will talk with his spouse and see how he can get the TB screening done. He agrees to call back today or tomorrow.

## 2024-01-01 NOTE — Telephone Encounter (Signed)
 OK  1) he needs to do a quantiferon test - we need that he is overdue  2) can help with Rx assistance  3) needs appt w/ me when insurance starts

## 2024-01-02 NOTE — Telephone Encounter (Signed)
 Patient will get it done and bring in the documentation. I will ask PJ to bring in the papers for your signature. I have them in my office. I am remote this week. Patty knows where they are.

## 2024-01-02 NOTE — Telephone Encounter (Signed)
 Patient asks if PPD placement will be acceptable for TB screening?

## 2024-01-02 NOTE — Telephone Encounter (Signed)
 It can - does he have documentation of one?

## 2024-01-15 NOTE — Telephone Encounter (Signed)
 Patient had a negative PPD. Results are recorded and scanned in uder the miscellaneous tab.

## 2024-01-15 NOTE — Telephone Encounter (Signed)
 PT is calling to verify that he faxed over the necessary information and the paperwork is in the media tab.

## 2024-01-15 NOTE — Telephone Encounter (Signed)
 Paperwork has been faxed to PA department to get their assistance.

## 2024-01-16 ENCOUNTER — Telehealth: Payer: Self-pay

## 2024-01-16 NOTE — Telephone Encounter (Signed)
 Pharmacy Patient Advocate Encounter  Received fax from office with completed Humira  [employer] application. This has been faxed to [employer].

## 2024-01-17 ENCOUNTER — Other Ambulatory Visit (HOSPITAL_BASED_OUTPATIENT_CLINIC_OR_DEPARTMENT_OTHER): Payer: Self-pay

## 2024-01-17 NOTE — Telephone Encounter (Signed)
 We received a fax from [employer] on 01/16/2024 with approval for patient's Humira  good through 01/15/2025. It states patient will receive shipments of Humira  at no cost.

## 2024-02-13 ENCOUNTER — Other Ambulatory Visit (HOSPITAL_COMMUNITY): Payer: Self-pay

## 2024-02-28 ENCOUNTER — Ambulatory Visit: Payer: Self-pay | Attending: Internal Medicine | Admitting: Pharmacist

## 2024-02-28 ENCOUNTER — Encounter: Payer: Self-pay | Admitting: Pharmacist

## 2024-02-28 DIAGNOSIS — Z79899 Other long term (current) drug therapy: Secondary | ICD-10-CM

## 2024-02-28 NOTE — Progress Notes (Signed)
° °  S: Patient presents  for review of their specialty medication therapy.  Patient is currently taking Humira  for ulcerative colitis. Patient is managed by Dr. Teressa for this.   Adherence: confirms  Efficacy: he continues to do well on Humira  with good results   Dosing:  Ulcerative colitis: 40 mg every week.  Dose adjustments: Renal: no dose adjustments (has not been studied) Hepatic: no dose adjustments (has not been studied)  Screening: TB test: negative Hepatitis: negative  Monitoring: S/sx of injection site reactions: denies  S/sx of infection: denies CBC: see below S/sx of hypersensitivity: denies S/sx of malignancy: denies S/sx of heart failure: denies  O:  Lab Results  Component Value Date   WBC 5.3 04/24/2023   HGB 15.9 04/24/2023   HCT 47.1 04/24/2023   MCV 99.6 04/24/2023   PLT 335.0 04/24/2023      Chemistry      Component Value Date/Time   NA 143 05/22/2023 0901   K 4.0 05/22/2023 0901   CL 104 05/22/2023 0901   CO2 32 05/22/2023 0901   BUN 10 05/22/2023 0901   CREATININE 1.18 05/22/2023 0901   CREATININE 1.33 01/03/2020 1608      Component Value Date/Time   CALCIUM 8.9 05/22/2023 0901   ALKPHOS 63 05/22/2023 0901   AST 16 05/22/2023 0901   ALT 21 05/22/2023 0901   BILITOT 1.0 05/22/2023 0901      A/P: 1. Medication review: Patient currently on Humira  for ulcerative colitis. Reviewed the medication with the patient, including the following: Humira  is a TNF blocking agent indicated for ankylosing spondylitis, Crohn's disease, Hidradenitis suppurativa, psoriatic arthritis, plaque psoriasis, ulcerative colitis, and uveitis. Patient educated on purpose, proper use and potential adverse effects of Humira . The most common adverse effects are infections, headache, and injection site reactions. There is the possibility of an increased risk of malignancy but it is not well understood if this increased risk is due to there medication or the disease  state. There are rare cases of pancytopenia and aplastic anemia. No recommendations for any changes at this time.   Herlene Fleeta Morris, PharmD, JAQUELINE, CPP Clinical Pharmacist [facility] [phone]

## 2024-04-12 ENCOUNTER — Other Ambulatory Visit (HOSPITAL_COMMUNITY): Payer: Self-pay

## 2024-04-12 ENCOUNTER — Other Ambulatory Visit: Payer: Self-pay

## 2024-04-12 ENCOUNTER — Other Ambulatory Visit: Payer: Self-pay | Admitting: Internal Medicine

## 2024-04-12 MED ORDER — METOPROLOL TARTRATE 50 MG PO TABS
ORAL_TABLET | ORAL | 1 refills | Status: AC
  Filled 2024-04-12: qty 270, 90d supply, fill #0

## 2024-04-12 MED ORDER — LOSARTAN POTASSIUM 50 MG PO TABS
100.0000 mg | ORAL_TABLET | Freq: Every day | ORAL | 1 refills | Status: AC
  Filled 2024-04-12: qty 180, 90d supply, fill #0

## 2024-04-16 ENCOUNTER — Other Ambulatory Visit: Payer: Self-pay

## 2024-05-24 ENCOUNTER — Encounter: Admitting: Internal Medicine
